# Patient Record
Sex: Female | Born: 1970 | Race: White | Hispanic: No | Marital: Single | Smoking: Never smoker
Health system: Southern US, Community
[De-identification: ages and names within clinical notes are randomized; demographics above are authoritative.]

## PROBLEM LIST (undated history)

## (undated) DIAGNOSIS — F419 Anxiety disorder, unspecified: Secondary | ICD-10-CM

## (undated) DIAGNOSIS — Z87440 Personal history of urinary (tract) infections: Secondary | ICD-10-CM

## (undated) DIAGNOSIS — I341 Nonrheumatic mitral (valve) prolapse: Secondary | ICD-10-CM

## (undated) DIAGNOSIS — J309 Allergic rhinitis, unspecified: Secondary | ICD-10-CM

## (undated) DIAGNOSIS — I10 Essential (primary) hypertension: Secondary | ICD-10-CM

## (undated) HISTORY — DX: Allergic rhinitis, unspecified: J30.9

## (undated) HISTORY — PX: TUBAL LIGATION: SHX77

## (undated) HISTORY — DX: Anxiety disorder, unspecified: F41.9

## (undated) HISTORY — DX: Personal history of urinary (tract) infections: Z87.440

---

## 2001-02-17 DIAGNOSIS — I341 Nonrheumatic mitral (valve) prolapse: Secondary | ICD-10-CM

## 2001-02-17 HISTORY — DX: Nonrheumatic mitral (valve) prolapse: I34.1

## 2008-11-26 ENCOUNTER — Emergency Department (HOSPITAL_COMMUNITY): Admission: EM | Admit: 2008-11-26 | Discharge: 2008-11-26 | Payer: Self-pay | Admitting: Emergency Medicine

## 2009-01-04 ENCOUNTER — Emergency Department: Payer: Self-pay | Admitting: Emergency Medicine

## 2009-08-03 ENCOUNTER — Emergency Department (HOSPITAL_COMMUNITY): Admission: EM | Admit: 2009-08-03 | Discharge: 2009-08-04 | Payer: Self-pay | Admitting: Emergency Medicine

## 2011-01-27 ENCOUNTER — Encounter: Payer: Self-pay | Admitting: *Deleted

## 2011-01-27 ENCOUNTER — Emergency Department (HOSPITAL_BASED_OUTPATIENT_CLINIC_OR_DEPARTMENT_OTHER)
Admission: EM | Admit: 2011-01-27 | Discharge: 2011-01-27 | Disposition: A | Discharge status: Home / Self Care | Payer: BC Managed Care – PPO | Attending: Emergency Medicine | Admitting: Emergency Medicine

## 2011-01-27 DIAGNOSIS — M549 Dorsalgia, unspecified: Secondary | ICD-10-CM | POA: Insufficient documentation

## 2011-01-27 DIAGNOSIS — N390 Urinary tract infection, site not specified: Secondary | ICD-10-CM | POA: Insufficient documentation

## 2011-01-27 LAB — URINALYSIS, ROUTINE W REFLEX MICROSCOPIC
Protein, ur: 100 mg/dL — AB
Urobilinogen, UA: 1 mg/dL (ref 0.0–1.0)

## 2011-01-27 LAB — PREGNANCY, URINE: Preg Test, Ur: NEGATIVE

## 2011-01-27 LAB — URINE MICROSCOPIC-ADD ON

## 2011-01-27 MED ORDER — CIPROFLOXACIN HCL 500 MG PO TABS: 500.0000 mg | ORAL_TABLET | Freq: Two times a day (BID) | ORAL | Status: AC

## 2011-01-27 MED ORDER — HYDROCODONE-ACETAMINOPHEN 5-500 MG PO TABS: 1.0000 | ORAL_TABLET | Freq: Four times a day (QID) | ORAL | Status: AC | PRN

## 2011-01-27 MED ORDER — HYDROCODONE-ACETAMINOPHEN 5-325 MG PO TABS
1.0000 | ORAL_TABLET | Freq: Once | ORAL | Status: AC
  Administered 2011-01-27: 1 via ORAL
  Filled 2011-01-27: qty 1

## 2011-01-27 MED ORDER — CIPROFLOXACIN HCL 500 MG PO TABS
500.0000 mg | ORAL_TABLET | Freq: Once | ORAL | Status: AC
  Administered 2011-01-27: 500 mg via ORAL
  Filled 2011-01-27: qty 1

## 2011-01-27 NOTE — ED Provider Notes (Signed)
History     CSN: [id number] Arrival date & time: 01/27/2011  8:00 PM   First MD Initiated Contact with Patient 01/27/11 2003      Chief Complaint  Patient presents with  . Back Pain    (Consider location/radiation/quality/duration/timing/severity/associated sxs/prior treatment) Patient is a 40 y.o. female presenting with back pain. The history is provided by the patient. No language interpreter was used.  Back Pain  This is a new problem. The current episode started 6 to 12 hours ago. The problem occurs constantly. The problem has not changed since onset.The pain is associated with no known injury. The pain is present in the lumbar spine. The quality of the pain is described as aching. The pain does not radiate. The pain is moderate. The pain is the same all the time. Pertinent negatives include no bladder incontinence, no dysuria and no tingling.    History reviewed. No pertinent past medical history.  Past Surgical History  Procedure Date  . Tubal ligation     No family history on file.  History  Substance Use Topics  . Smoking status: Never Smoker   . Smokeless tobacco: Not on file  . Alcohol Use: No    OB History    Grav Para Term Preterm Abortions TAB SAB Ect Mult Living                  Review of Systems  Genitourinary: Negative for bladder incontinence and dysuria.  Musculoskeletal: Positive for back pain.  Neurological: Negative for tingling.  All other systems reviewed and are negative.    Allergies  Aspirin  Home Medications   Current Outpatient Rx  Name Route Sig Dispense Refill  . FLUTICASONE PROPIONATE 50 MCG/ACT NA SUSP Nasal Place 2 sprays into the nose daily as needed. For allergies     . VICODIN PO Oral Take 1 tablet by mouth once.      . IBUPROFEN 200 MG PO TABS Oral Take 800 mg by mouth every 6 (six) hours as needed. For pain     . ROBAXIN PO Oral Take 2 tablets by mouth once.      Marland Kitchen CIPROFLOXACIN HCL 500 MG PO TABS Oral Take 1 tablet  (500 mg total) by mouth every 12 (twelve) hours. 6 tablet 0  . HYDROCODONE-ACETAMINOPHEN 5-500 MG PO TABS Oral Take 1-2 tablets by mouth every 6 (six) hours as needed for pain. 6 tablet 0    BP 150/98  Pulse 83  Temp(Src) 98.8 F (37.1 C) (Oral)  Resp 18  Ht 5\' 6"  (1.676 m)  Wt 270 lb (122.471 kg)  BMI 43.58 kg/m2  SpO2 100%  Physical Exam  Nursing note and vitals reviewed. Constitutional: She is oriented to person, place, and time. She appears well-developed and well-nourished.  HENT:  Head: Normocephalic and atraumatic.  Eyes: Pupils are equal, round, and reactive to light.  Neck: Normal range of motion.  Cardiovascular: Normal rate and regular rhythm.   Pulmonary/Chest: Effort normal and breath sounds normal.  Abdominal: Soft. Bowel sounds are normal. She exhibits no distension. There is no tenderness.  Musculoskeletal: Normal range of motion.  Neurological: She is alert and oriented to person, place, and time.  Skin: Skin is warm and dry.  Psychiatric: She has a normal mood and affect.    ED Course  Procedures (including critical care time)  Labs Reviewed  URINALYSIS, ROUTINE W REFLEX MICROSCOPIC - Abnormal; Notable for the following:    APPearance CLOUDY (*)    Hgb  urine dipstick LARGE (*)    Protein, ur 100 (*)    Nitrite POSITIVE (*)    Leukocytes, UA MODERATE (*)    All other components within normal limits  URINE MICROSCOPIC-ADD ON - Abnormal; Notable for the following:    Bacteria, UA MANY (*)    All other components within normal limits  PREGNANCY, URINE   No results found.   1. UTI (lower urinary tract infection)       MDM  Pt treated her for uti:no sign of pyelo    Medical screening examination/treatment/procedure(s) were performed by non-physician practitioner and as supervising physician I was immediately available for consultation/collaboration. Osvaldo Human, M.D.     Teressa Lower, NP 01/27/11 2015  Carleene Cooper III,  MD 01/28/11 1330

## 2011-01-27 NOTE — ED Notes (Signed)
Back pain since she woke this am. Denies urinary complaints.

## 2011-04-09 ENCOUNTER — Encounter (HOSPITAL_BASED_OUTPATIENT_CLINIC_OR_DEPARTMENT_OTHER): Payer: Self-pay | Admitting: *Deleted

## 2011-04-09 ENCOUNTER — Emergency Department (HOSPITAL_BASED_OUTPATIENT_CLINIC_OR_DEPARTMENT_OTHER)
Admission: EM | Admit: 2011-04-09 | Discharge: 2011-04-09 | Disposition: A | Discharge status: Home Health | Payer: No Typology Code available for payment source | Attending: Emergency Medicine | Admitting: Emergency Medicine

## 2011-04-09 DIAGNOSIS — I10 Essential (primary) hypertension: Secondary | ICD-10-CM | POA: Insufficient documentation

## 2011-04-09 DIAGNOSIS — Y92410 Unspecified street and highway as the place of occurrence of the external cause: Secondary | ICD-10-CM | POA: Insufficient documentation

## 2011-04-09 DIAGNOSIS — S139XXA Sprain of joints and ligaments of unspecified parts of neck, initial encounter: Secondary | ICD-10-CM | POA: Insufficient documentation

## 2011-04-09 DIAGNOSIS — S161XXA Strain of muscle, fascia and tendon at neck level, initial encounter: Secondary | ICD-10-CM

## 2011-04-09 HISTORY — DX: Essential (primary) hypertension: I10

## 2011-04-09 HISTORY — DX: Nonrheumatic mitral (valve) prolapse: I34.1

## 2011-04-09 MED ORDER — METHOCARBAMOL 500 MG PO TABS: 500.0000 mg | ORAL_TABLET | Freq: Two times a day (BID) | ORAL | Status: DC

## 2011-04-09 MED ORDER — METHOCARBAMOL 500 MG PO TABS: 500.0000 mg | ORAL_TABLET | Freq: Two times a day (BID) | ORAL | Status: AC

## 2011-04-09 MED ORDER — HYDROCODONE-ACETAMINOPHEN 5-325 MG PO TABS: 2.0000 | ORAL_TABLET | ORAL | Status: AC | PRN

## 2011-04-09 MED ORDER — HYDROCODONE-ACETAMINOPHEN 5-325 MG PO TABS: 2.0000 | ORAL_TABLET | ORAL | Status: DC | PRN

## 2011-04-09 NOTE — Discharge Instructions (Signed)
Cervical Sprain and Strain °A cervical sprain is an injury to the neck. The injury can include either over-stretching or even small tears in the ligaments that hold the bones of the neck in place. A strain affects muscles and tendons. Minor injuries usually only involve ligaments and muscles. Because the different parts of the neck are so close together, more severe injuries can involve both sprain and strain. These injuries can affect the muscles, ligaments, tendons, discs, and nerves in the neck. °CAUSES  °An injury may be the result of a direct blow or from certain habits that can lead to the symptoms noted above. °· Injury from:  °· Contact sports (such as football, rugby, wrestling, hockey, auto racing, gymnastics, diving, martial arts, and boxing).  °· Motor vehicle accidents.  °· Whiplash injuries (see image at right). These are common. They occur when the neck is forcefully whipped or forced backward and/or forward.  °· Falls.  °· Lifestyle or awkward postures:  °· Cradling a telephone between the ear and shoulder.  °· Sitting in a chair that offers no support.  °· Working at an ill-designed computer station.  °· Activities that require hours of repeated or long periods of looking up (stretching the neck backward) or looking down (bending the head/neck forward).  °SYMPTOMS  °· Pain, soreness, stiffness, or burning sensation in the front, back, or sides of the neck. This may develop immediately after injury. Onset of discomfort may also develop slowly and not begin for 24 hours or more.  °· Shoulder and/or upper back pain.  °· Limits to the normal movement of the neck.  °· Headache.  °· Dizziness.  °· Weakness and/or abnormal sensation (such as numbness or tingling) of one or both arms and/or hands.  °· Muscle spasm.  °· Difficulty with swallowing or chewing.  °· Tenderness and swelling at the injury site.  °DIAGNOSIS  °Most of the time, your caregiver can diagnose this problem with a careful history and  examination. The history will include information about known problems (such as arthritis in the neck) or a previous neck injury. X-rays may be ordered to find out if there is a different problem. X-rays can also help to find problems with the bones of the neck not related to the injury or current symptoms. °TREATMENT  °Several treatment options are available to help pain, spasm, and other symptoms. They include: °· Cold helps relieve pain and reduce inflammation. Cold should be applied for 10 to 15 minutes every 2 to 3 hours after any activity that aggravates your symptoms. Use ice packs or an ice massage. Place a towel or cloth in between your skin and the ice pack.  °· Medication:  °· Only take over-the-counter or prescription medicines for pain, discomfort, or fever as directed by your caregiver.  °· Pain relievers or muscle relaxants may be prescribed. Use only as directed and only as much as you need.  °· Change in the activity that caused the problem. This might include using a headset with a telephone so that the phone is not propped between your ear and shoulder.  °· Neck collar. Your caregiver may recommend temporary use of a soft cervical collar.  °· Work station. Changes may be needed in your work place. A better sitting position and/or better posture during work may be part of your treatment.  °· Physical Therapy. Your caregiver may recommend physical therapy. This can include instructions in the use of stretching and strengthening exercises. Improvement in posture is important.   Exercises and posture training can help stabilize the neck and strengthen muscles and keep symptoms from returning.  °HOME CARE INSTRUCTIONS  °Other than formal physical therapy, all treatments above can be done at home. Even when not at work, it is important to be conscious of your posture and of activities that can cause a return of symptoms. °Most cervical sprains and/or strains are better in 1-3 weeks. As you improve and  increase activities, doing a warm up and stretching before the activity will help prevent recurrent problems. °SEEK MEDICAL CARE IF:  °· Pain is not effectively controlled with medication.  °· You feel unable to decrease pain medication over time as planned.  °· Activity level is not improving as planned and/or expected.  °SEEK IMMEDIATE MEDICAL CARE IF:  °· While using medication, you develop any bleeding, stomach upset, or signs of an allergic reaction.  °· Symptoms get worse, become intolerable, and are not helped by medications.  °· New, unexplained symptoms develop.  °· You experience numbness, tingling, weakness, or paralysis of any part of your body.  °MAKE SURE YOU:  °· Understand these instructions.  °· Will watch your condition.  °· Will get help right away if you are not doing well or get worse.  °Document Released: 12/01/2006 Document Revised: 10/16/2010 Document Reviewed: 12/01/2006 °[employer]® Patient Information ©2012 [employer]. °
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
## 2011-04-09 NOTE — ED Provider Notes (Signed)
Medical screening examination/treatment/procedure(s) were performed by non-physician practitioner and as supervising physician I was immediately available for consultation/collaboration.   Tyffani Foglesong, MD 04/09/11 2328 
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
## 2011-05-25 ENCOUNTER — Emergency Department (HOSPITAL_BASED_OUTPATIENT_CLINIC_OR_DEPARTMENT_OTHER)
Admission: EM | Admit: 2011-05-25 | Discharge: 2011-05-25 | Disposition: A | Discharge status: Home / Self Care | Payer: BC Managed Care – PPO | Attending: Emergency Medicine | Admitting: Emergency Medicine

## 2011-05-25 ENCOUNTER — Encounter (HOSPITAL_BASED_OUTPATIENT_CLINIC_OR_DEPARTMENT_OTHER): Payer: Self-pay | Admitting: Emergency Medicine

## 2011-05-25 ENCOUNTER — Emergency Department (INDEPENDENT_AMBULATORY_CARE_PROVIDER_SITE_OTHER): Payer: BC Managed Care – PPO

## 2011-05-25 DIAGNOSIS — J069 Acute upper respiratory infection, unspecified: Secondary | ICD-10-CM

## 2011-05-25 DIAGNOSIS — J029 Acute pharyngitis, unspecified: Secondary | ICD-10-CM | POA: Insufficient documentation

## 2011-05-25 DIAGNOSIS — I059 Rheumatic mitral valve disease, unspecified: Secondary | ICD-10-CM | POA: Insufficient documentation

## 2011-05-25 DIAGNOSIS — R059 Cough, unspecified: Secondary | ICD-10-CM | POA: Insufficient documentation

## 2011-05-25 DIAGNOSIS — R05 Cough: Secondary | ICD-10-CM | POA: Insufficient documentation

## 2011-05-25 DIAGNOSIS — R0989 Other specified symptoms and signs involving the circulatory and respiratory systems: Secondary | ICD-10-CM

## 2011-05-25 DIAGNOSIS — I10 Essential (primary) hypertension: Secondary | ICD-10-CM | POA: Insufficient documentation

## 2011-05-25 MED ORDER — LORATADINE-PSEUDOEPHEDRINE ER 10-240 MG PO TB24: 1.0000 | ORAL_TABLET | Freq: Every day | ORAL | Status: DC

## 2011-05-25 MED ORDER — GI COCKTAIL ~~LOC~~
30.0000 mL | Freq: Once | ORAL | Status: AC
  Administered 2011-05-25: 30 mL via ORAL
  Filled 2011-05-25: qty 30

## 2011-05-25 MED ORDER — HYDROCODONE-ACETAMINOPHEN 7.5-500 MG/15ML PO SOLN: 15.0000 mL | Freq: Four times a day (QID) | ORAL | Status: AC | PRN

## 2011-05-25 MED ORDER — LORATADINE 10 MG PO TABS
10.0000 mg | ORAL_TABLET | Freq: Once | ORAL | Status: AC
  Administered 2011-05-25: 10 mg via ORAL
  Filled 2011-05-25: qty 1

## 2011-05-25 MED ORDER — DEXAMETHASONE 4 MG PO TABS
8.0000 mg | ORAL_TABLET | Freq: Once | ORAL | Status: AC
  Administered 2011-05-25: 8 mg via ORAL
  Filled 2011-05-25: qty 2

## 2011-05-25 NOTE — ED Provider Notes (Signed)
History     CSN: [id number]  Arrival date & time 05/25/11  0747   First MD Initiated Contact with Patient 05/25/11 [phone]      Chief Complaint  Patient presents with  . Sore Throat    (Consider location/radiation/quality/duration/timing/severity/associated sxs/prior treatment) Patient is a 41 y.o. female presenting with pharyngitis and URI. The history is provided by the patient. No language interpreter was used.  Sore Throat The current episode started 2 days ago. The problem occurs constantly. The problem has been gradually worsening. Pertinent negatives include no chest pain, no abdominal pain, no headaches and no shortness of breath. The symptoms are aggravated by swallowing and coughing. The symptoms are relieved by nothing. Treatments tried: mucinex. The treatment provided mild relief.  URI The primary symptoms include sore throat and cough. Primary symptoms do not include fever, fatigue, headaches, ear pain, swollen glands, wheezing, abdominal pain, nausea, vomiting, myalgias or arthralgias. The current episode started 2 days ago. This is a new problem. The problem has been gradually worsening.  The cough began 2 days ago. The cough is new. The cough is dry.  Symptoms associated with the illness include congestion and rhinorrhea. The illness is not associated with chills.    Past Medical History  Diagnosis Date  . Hypertension   . MVP (mitral valve prolapse)     Past Surgical History  Procedure Date  . Tubal ligation     No family history on file.  History  Substance Use Topics  . Smoking status: Never Smoker   . Smokeless tobacco: Not on file  . Alcohol Use: No    OB History    Grav Para Term Preterm Abortions TAB SAB Ect Mult Living                  Review of Systems  Constitutional: Negative for fever, chills, activity change, appetite change and fatigue.  HENT: Positive for congestion, sore throat and rhinorrhea. Negative for ear pain, neck pain and neck  stiffness.   Respiratory: Positive for cough and chest tightness. Negative for shortness of breath and wheezing.   Cardiovascular: Negative for chest pain and palpitations.  Gastrointestinal: Negative for nausea, vomiting and abdominal pain.  Genitourinary: Negative for dysuria, urgency, frequency and flank pain.  Musculoskeletal: Negative for myalgias, back pain and arthralgias.  Neurological: Negative for dizziness, weakness, light-headedness, numbness and headaches.  All other systems reviewed and are negative.    Allergies  Aspirin  Home Medications   Current Outpatient Rx  Name Route Sig Dispense Refill  . MUCINEX PO Oral Take by mouth every 12 (twelve) hours.    Marland Kitchen FLUTICASONE PROPIONATE 50 MCG/ACT NA SUSP Nasal Place 2 sprays into the nose daily as needed. For allergies     . HYDROCODONE-ACETAMINOPHEN 7.5-500 MG/15ML PO SOLN Oral Take 15 mLs by mouth every 6 (six) hours as needed for pain. 60 mL 0  . VICODIN PO Oral Take 1 tablet by mouth once.      Marland Kitchen LORATADINE-PSEUDOEPHEDRINE ER 10-240 MG PO TB24 Oral Take 1 tablet by mouth daily. 10 tablet 0  . ROBAXIN PO Oral Take 2 tablets by mouth once.      Marland Kitchen METOPROLOL SUCCINATE ER 50 MG PO TB24 Oral Take 50 mg by mouth daily. Take with or immediately following a meal.    . NAPROXEN 125 MG/5ML PO SUSP Oral Take 250 mg by mouth 2 (two) times daily.      BP 157/97  Pulse 93  Temp(Src) 99.1 F (  37.3 C) (Oral)  Resp 18  SpO2 100%  LMP 04/24/2011  Physical Exam  Nursing note and vitals reviewed. Constitutional: She is oriented to person, place, and time. She appears well-developed and well-nourished. No distress.       Morbidly obese  HENT:  Head: Normocephalic and atraumatic.  Mouth/Throat: No oropharyngeal exudate.       Erythematous oropharynx  Eyes: Conjunctivae and EOM are normal. Pupils are equal, round, and reactive to light.  Neck: Normal range of motion. Neck supple.  Cardiovascular: Normal rate, regular rhythm, normal  heart sounds and intact distal pulses.  Exam reveals no gallop and no friction rub.   No murmur heard. Pulmonary/Chest: Effort normal and breath sounds normal. No respiratory distress. She exhibits tenderness (upper chest tenderness on palpation).  Abdominal: Soft. Bowel sounds are normal. There is no tenderness.  Musculoskeletal: Normal range of motion. She exhibits no edema and no tenderness.  Neurological: She is alert and oriented to person, place, and time. No cranial nerve deficit.  Skin: Skin is warm and dry. No rash noted.    ED Course  Procedures (including critical care time)  Labs Reviewed - No data to display Dg Chest 2 View  05/25/2011  *RADIOLOGY REPORT*  Clinical Data: Cough and congestion.  CHEST - 2 VIEW  Comparison: None.  Findings: No evidence of pulmonary infiltrate, edema or pleural fluid.  Heart size is within normal limits.  Bony thorax is unremarkable.  IMPRESSION: No active disease.  Original Report Authenticated By: Reola Calkins, M.D.     1. URI (upper respiratory infection)       MDM  Consistent with an upper respiratory infection. She does have evidence of mild pharyngitis. She was given Decadron, Claritin emergency department. Just received a GI cocktail. She'll be discharged home with instructions to take Claritin-D, Lortab elixir for symptom control. Talked about her primary care physician. Encouraged aggressive oral hydration. Chest x-ray is negative.        Dayton Bailiff, MD 05/25/11 [phone]

## 2011-05-25 NOTE — ED Notes (Signed)
Pt c/o sore throat & chest congestion since Fri

## 2011-05-25 NOTE — Discharge Instructions (Signed)
Upper Respiratory Infection, Adult An upper respiratory infection (URI) is also known as the common cold. It is often caused by a type of germ (virus). Colds are easily spread (contagious). You can pass it to others by kissing, coughing, sneezing, or drinking out of the same glass. Usually, you get better in 1 or 2 weeks.  HOME CARE   Only take medicine as told by your doctor.   Use a warm mist humidifier or breathe in steam from a hot shower.   Drink enough water and fluids to keep your pee (urine) clear or pale yellow.   Get plenty of rest.   Return to work when your temperature is back to normal or as told by your doctor. You may use a face mask and wash your hands to stop your cold from spreading.  GET HELP RIGHT AWAY IF:   After the first few days, you feel you are getting worse.   You have questions about your medicine.   You have chills, shortness of breath, or brown or red spit (mucus).   You have yellow or brown snot (nasal discharge) or pain in the face, especially when you bend forward.   You have a fever, puffy (swollen) neck, pain when you swallow, or white spots in the back of your throat.   You have a bad headache, ear pain, sinus pain, or chest pain.   You have a high-pitched whistling sound when you breathe in and out (wheezing).   You have a lasting cough or cough up blood.   You have sore muscles or a stiff neck.  MAKE SURE YOU:   Understand these instructions.   Will watch your condition.   Will get help right away if you are not doing well or get worse.  Document Released: 07/23/2007 Document Revised: 01/23/2011 Document Reviewed: 06/10/2010 [facility] Patient Information 2012 [place], [place].

## 2011-08-01 ENCOUNTER — Emergency Department (HOSPITAL_BASED_OUTPATIENT_CLINIC_OR_DEPARTMENT_OTHER)
Admission: EM | Admit: 2011-08-01 | Discharge: 2011-08-01 | Disposition: A | Discharge status: Home / Self Care | Payer: BC Managed Care – PPO | Attending: Emergency Medicine | Admitting: Emergency Medicine

## 2011-08-01 ENCOUNTER — Emergency Department (HOSPITAL_BASED_OUTPATIENT_CLINIC_OR_DEPARTMENT_OTHER): Payer: BC Managed Care – PPO

## 2011-08-01 ENCOUNTER — Encounter (HOSPITAL_BASED_OUTPATIENT_CLINIC_OR_DEPARTMENT_OTHER): Payer: Self-pay | Admitting: Emergency Medicine

## 2011-08-01 DIAGNOSIS — S53401A Unspecified sprain of right elbow, initial encounter: Secondary | ICD-10-CM

## 2011-08-01 DIAGNOSIS — I10 Essential (primary) hypertension: Secondary | ICD-10-CM | POA: Insufficient documentation

## 2011-08-01 DIAGNOSIS — Y9229 Other specified public building as the place of occurrence of the external cause: Secondary | ICD-10-CM | POA: Insufficient documentation

## 2011-08-01 DIAGNOSIS — Y990 Civilian activity done for income or pay: Secondary | ICD-10-CM | POA: Insufficient documentation

## 2011-08-01 DIAGNOSIS — Y93F2 Activity, caregiving, lifting: Secondary | ICD-10-CM | POA: Insufficient documentation

## 2011-08-01 DIAGNOSIS — IMO0002 Reserved for concepts with insufficient information to code with codable children: Secondary | ICD-10-CM | POA: Insufficient documentation

## 2011-08-01 DIAGNOSIS — I059 Rheumatic mitral valve disease, unspecified: Secondary | ICD-10-CM | POA: Insufficient documentation

## 2011-08-01 DIAGNOSIS — X500XXA Overexertion from strenuous movement or load, initial encounter: Secondary | ICD-10-CM | POA: Insufficient documentation

## 2011-08-01 MED ORDER — METOPROLOL SUCCINATE ER 25 MG PO TB24: 50.0000 mg | ORAL_TABLET | Freq: Every day | ORAL | Status: DC

## 2011-08-01 MED ORDER — IBUPROFEN 800 MG PO TABS
800.0000 mg | ORAL_TABLET | Freq: Once | ORAL | Status: AC
  Administered 2011-08-01: 800 mg via ORAL
  Filled 2011-08-01: qty 1

## 2011-08-01 MED ORDER — IBUPROFEN 800 MG PO TABS: 800.0000 mg | ORAL_TABLET | Freq: Three times a day (TID) | ORAL | Status: AC | PRN

## 2011-08-01 NOTE — ED Provider Notes (Signed)
History     CSN: [id number]  Arrival date & time 08/01/11  0020   First MD Initiated Contact with Audrey Rodgers 08/01/11 0038      Chief Complaint  Audrey Rodgers presents with  . Arm Injury    (Consider location/radiation/quality/duration/timing/severity/associated sxs/prior treatment) HPI Audrey Rodgers is a 41 year old female who presents today for evaluation of right elbow pain that she rates as an 8/10 in characterized as a sharp.. This radiates from Audrey Rodgers elbow down Audrey Rodgers forearm and into Audrey Rodgers hand. Audrey Rodgers has noted pain since being handcuffed in police custody yesterday. She works as an Public house manager and also noted pain with movement of some patients this evening at work. Audrey Rodgers says the pain is worse with lifting or movement. She has not taken any medications for this or applied ice. Audrey Rodgers denies history of prior injury. Audrey Rodgers also was noted to be hypertensive on presentation and has been out of Audrey Rodgers blood pressure medications for a month. She denies any chest pain, shortness of breath, or lightheadedness. There are no other associated or modifying factors. Past Medical History  Diagnosis Date  . Hypertension   . MVP (mitral valve prolapse)     Past Surgical History  Procedure Date  . Tubal ligation     History reviewed. No pertinent family history.  History  Substance Use Topics  . Smoking status: Never Smoker   . Smokeless tobacco: Never Used  . Alcohol Use: Yes     rarely    OB History    Grav Para Term Preterm Abortions TAB SAB Ect Mult Living                  Review of Systems  Constitutional: Negative.   HENT: Negative.   Eyes: Negative.   Respiratory: Negative.   Cardiovascular: Negative.   Gastrointestinal: Negative.   Genitourinary: Negative.   Musculoskeletal:       Right elbow pain as noted in history of present illness  Skin: Negative.   Neurological: Negative.   Hematological: Negative.   Psychiatric/Behavioral: Negative.   All other systems reviewed and are  negative.    Allergies  Aspirin  Home Medications   Current Outpatient Rx  Name Route Sig Dispense Refill  . METOPROLOL SUCCINATE ER 50 MG PO TB24 Oral Take 50 mg by mouth daily. Take with or immediately following a meal.    . NAPROXEN 125 MG/5ML PO SUSP Oral Take 250 mg by mouth 2 (two) times daily.    Marland Kitchen FLUTICASONE PROPIONATE 50 MCG/ACT NA SUSP Nasal Place 2 sprays into the nose daily as needed. For allergies     . IBUPROFEN 800 MG PO TABS Oral Take 1 tablet (800 mg total) by mouth every 8 (eight) hours as needed for pain. 30 tablet 0  . LORATADINE-PSEUDOEPHEDRINE ER 10-240 MG PO TB24 Oral Take 1 tablet by mouth daily. 10 tablet 0  . METOPROLOL SUCCINATE ER 25 MG PO TB24 Oral Take 2 tablets (50 mg total) by mouth daily. 30 tablet 1    BP 154/103  Pulse 91  Temp 98.8 F (37.1 C) (Oral)  Resp 16  Ht 5\' 6"  (1.676 m)  Wt 282 lb (127.914 kg)  BMI 45.52 kg/m2  SpO2 98%  LMP 07/21/2011  Physical Exam  Nursing note and vitals reviewed. GEN: Well-developed, well-nourished female in no distress HEENT: Atraumatic, normocephalic.  EYES: PERRLA BL, no scleral icterus. NECK: Trachea midline, no meningismus CV: regular rate and rhythm.  PULM: No respiratory distress.   Neuro: cranial nerves grossly 2-12  intact, no abnormalities of strength or sensation, A and O x 3 MSK: Tenderness to palpation noted over the right proximal forearm. No bony deformity noted. No tenderness to palpation of the right olecranon or deformity. Audrey Rodgers neurovascularly intact distally. Remainder of exam is within normal limits. Skin: No rashes petechiae, purpura, or jaundice Psych: no abnormality of mood   ED Course  Procedures (including critical care time)  Labs Reviewed - No data to display Dg Elbow Complete Right  08/01/2011  *RADIOLOGY REPORT*  Clinical Data: Right elbow pain  RIGHT ELBOW - COMPLETE 3+ VIEW  Comparison: None.  Findings: No displaced fracture or dislocation.  No aggressive osseous  lesion.  No joint effusion.  IMPRESSION: No acute osseous abnormality identified. If clinical concern for a fracture persists, recommend a repeat radiograph in 5-10 days to evaluate for interval change or callus formation.  Original Report Authenticated By: Waneta Martins, M.D.     1. Sprain of right elbow       MDM  Audrey Rodgers was evaluated by myself. Based on evaluation Audrey Rodgers was given ibuprofen and an ice pack. I have low suspicion for bony injury. Plain film was performed and reviewed extemporaneously by myself with no identification of fracture. Radiology interpretation confirmed this. Audrey Rodgers was told that she likely had a strain from positioning given Audrey Rodgers body habitus and being handcuffed. Audrey Rodgers also was given a prescription for Audrey Rodgers blood pressure medication. She did not have any chest pain, shortness of breath, lightheadedness, or other symptoms concerning for hypertensive emergency or urgency. She was discharged in good condition and told that she could followup with Dr. Pearletha Forge a sports medicine if she had continued difficulty with Audrey Rodgers elbow that she should not require any followup based on my evaluation.        Cyndra Numbers, MD 08/01/11 [phone]

## 2011-08-01 NOTE — ED Notes (Signed)
Pt was in handcuffs yesterday and since that time her right elbow and forearm have been hurting.  Today the pain became severe. [Pt has been out of her bp med x1 month]

## 2011-08-01 NOTE — Discharge Instructions (Signed)
Elbow Injury  You or your child has an elbow injury. X-rays and exam today do not show evidence of a fracture (broken bone). That means that only a sling or splint may be required for a brief period of time as directed by your caregiver.  HOME CARE INSTRUCTIONS   Only take over-the-counter or prescription medicines for pain, discomfort, or fever as directed by your caregiver.   If you have a splint held on with an elastic wrap or a cast, watch your hand or fingers. If they become numb or cold and blue, loosen the wrap and reapply more loosely. See your caregiver if there is no relief.   You may use ice on your elbow for 15 to 20 minutes, 3 to 4 times per day, for the first 2 to 3 days.   Use your elbow as directed.   See your caregiver as directed. It is very important to keep all follow-up referrals and appointments in order to avoid any long-term problems with your elbow including chronic pain or inability to move the elbow normally.  SEEK IMMEDIATE MEDICAL CARE IF:    There is swelling or increasing pain in your elbow which is not relieved with medications.   You begin to lose feeling in your hand or fingers, or develop swelling of the hand and fingers.   You get a cold or blue hand or fingers on injured side.   If your elbow remains sore, your caregiver may want to x-ray it again. A hairline fracture may not show up on the first x-rays and may only be seen on repeat x-rays ten days to two weeks later. A specialist (radiologist) may examine your x-rays at a later time. In order to get results from the radiologist or their department, make sure you know how and when you are to get that information. It is your responsibility to get results of any tests you may have had.  MAKE SURE YOU:    Understand these instructions.   Will watch your condition.   Will get help right away if you are not doing well or get worse.  Document Released: 04/26/2003 Document Revised: 01/23/2011 Document Reviewed:  09/22/2007  [employer] Patient Information 2012 [employer].

## 2011-08-01 NOTE — ED Notes (Signed)
Pt has pain to right arm that starts above elbow and radiates down her arm after being arrested yesterday and placed in handcuffs. Pt has difficulty grasping objects with right hand.

## 2012-02-02 ENCOUNTER — Ambulatory Visit (INDEPENDENT_AMBULATORY_CARE_PROVIDER_SITE_OTHER): Payer: BC Managed Care – PPO | Admitting: Medical

## 2012-02-02 ENCOUNTER — Encounter: Payer: Self-pay | Admitting: Medical

## 2012-02-02 VITALS — BP 140/100 | HR 82 | Temp 98.5°F | Resp 18 | Ht 65.0 in | Wt 279.0 lb

## 2012-02-02 DIAGNOSIS — J309 Allergic rhinitis, unspecified: Secondary | ICD-10-CM

## 2012-02-02 DIAGNOSIS — I059 Rheumatic mitral valve disease, unspecified: Secondary | ICD-10-CM

## 2012-02-02 DIAGNOSIS — I341 Nonrheumatic mitral (valve) prolapse: Secondary | ICD-10-CM

## 2012-02-02 DIAGNOSIS — I10 Essential (primary) hypertension: Secondary | ICD-10-CM

## 2012-02-02 DIAGNOSIS — R079 Chest pain, unspecified: Secondary | ICD-10-CM

## 2012-02-02 DIAGNOSIS — E669 Obesity, unspecified: Secondary | ICD-10-CM

## 2012-02-02 DIAGNOSIS — M2550 Pain in unspecified joint: Secondary | ICD-10-CM

## 2012-02-02 LAB — CBC WITH DIFFERENTIAL/PLATELET
Basophils Relative: 0 % (ref 0–1)
HCT: 37.5 % (ref 36.0–46.0)
Hemoglobin: 12.4 g/dL (ref 12.0–15.0)
Lymphocytes Relative: 32 % (ref 12–46)
Lymphs Abs: 2.2 10*3/uL (ref 0.7–4.0)
MCHC: 33.1 g/dL (ref 30.0–36.0)
Monocytes Absolute: 0.4 10*3/uL (ref 0.1–1.0)
Monocytes Relative: 6 % (ref 3–12)
Neutro Abs: 4 10*3/uL (ref 1.7–7.7)

## 2012-02-02 MED ORDER — DICLOFENAC SODIUM 75 MG PO TBEC: 75.0000 mg | DELAYED_RELEASE_TABLET | Freq: Two times a day (BID) | ORAL | Status: DC

## 2012-02-02 NOTE — Progress Notes (Signed)
Subjective:   HPI  Audrey Rodgers is a 41 y.o. female who presents as a new patient.  Was seeing [facility] prior, but ended up moving here for work.  She notes uncontrolled BP despite being on Toprol 50mg  XL. Been on this since she was diagnosed with hypertension.  No other medications have been tried.  Gets headaches regularly.  Thinks this is related to her BP.  She is a nonsmoker.  She is not exercising, not currently using diet discretion.  She notes multiple joint pains ongoing.  Knees always hurt, but gets pains in elbows, wrists as well.  Last echocardiogram about 9-10 years ago. She reports being under a lot of stress.  Gets occasion chest pains.  Sometimes worse with exercise, sometimes worse with stress, sometimes seems related to being gassy and bloated.     No other c/o.  The following portions of the patient's history were reviewed and updated as appropriate: allergies, current medications, past family history, past medical history, past social history, past surgical history and problem list.  Past Medical History  Diagnosis Date  . Hypertension   . MVP (mitral valve prolapse) 2003    echocardiogram   . Allergic rhinitis   . History of recurrent urinary tract infection   . Anxiety     Allergies  Allergen Reactions  . Aspirin Palpitations     Review of Systems ROS reviewed and was negative other than noted in HPI or above.    Objective:   Physical Exam  General appearance: alert, no distress, WD/WN, obese, white female Oral cavity: MMM, no lesions Neck: supple, no lymphadenopathy, no thyromegaly, no masses, no bruits Heart: RRR, normal S1, S2, no murmurs Lungs: CTA bilaterally, no wheezes, rhonchi, or rales Pulses: 2+ symmetric, upper and lower extremities, normal cap refill Neuro: CN2-12 intact, normal strength and sensation, normal DTRs, nonfocal exam   Adult ECG Report  Indication: hypertension, chest pain  Rate: 71 bpm  Rhythm: normal sinus  rhythm  QRS Axis: 71 degrees  PR Interval: 134 ms  QRS Duration: 78 ms  QTc: 432 ms  Conduction Disturbances: none  Other Abnormalities: nonspecific T wave inversion V1  Patient's cardiac risk factors are: hypertension and obesity (BMI >= 30 kg/m2).  EKG comparison: none  Narrative Interpretation: normal EKG   Assessment and Plan :     Encounter Diagnoses  Name Primary?  . Essential hypertension, benign Yes  . Obesity   . Chest pain   . MVP (mitral valve prolapse)   . Allergic rhinitis   . Polyarthralgia    HTN - pending labs, consider increasing Metoprolol to 100 mg and adding a second medication.  Advised weight loss, diet changes, low salt diet.  Obesity - discussed need to work on lifestyle changes and lose weight  Chest pain - multifactorial, likely GERD, anxiety associated  MVP - request prior records  Allergic rhinitis- advised OTC zyrtec daily  polyarthralgia - script for trial of Diclofenac, weight loss

## 2012-02-03 ENCOUNTER — Other Ambulatory Visit: Payer: Self-pay | Admitting: Medical

## 2012-02-03 LAB — COMPREHENSIVE METABOLIC PANEL
Alkaline Phosphatase: 60 U/L (ref 39–117)
BUN: 12 mg/dL (ref 6–23)
Creat: 0.64 mg/dL (ref 0.50–1.10)
Glucose, Bld: 100 mg/dL — ABNORMAL HIGH (ref 70–99)
Total Bilirubin: 0.8 mg/dL (ref 0.3–1.2)

## 2012-02-03 LAB — LIPID PANEL
Cholesterol: 160 mg/dL (ref 0–200)
HDL: 46 mg/dL (ref >39)
Total CHOL/HDL Ratio: 3.5 Ratio
Triglycerides: 97 mg/dL (ref <150)

## 2012-02-03 LAB — T4, FREE: Free T4: 1.14 ng/dL (ref 0.80–1.80)

## 2012-02-03 MED ORDER — LISINOPRIL 10 MG PO TABS: 10.0000 mg | ORAL_TABLET | Freq: Every day | ORAL | Status: DC

## 2012-02-03 MED ORDER — METOPROLOL SUCCINATE ER 100 MG PO TB24: 100.0000 mg | ORAL_TABLET | Freq: Every day | ORAL | Status: DC

## 2012-03-08 ENCOUNTER — Ambulatory Visit (INDEPENDENT_AMBULATORY_CARE_PROVIDER_SITE_OTHER): Payer: BC Managed Care – PPO | Admitting: Medical

## 2012-03-08 ENCOUNTER — Encounter: Payer: Self-pay | Admitting: Medical

## 2012-03-08 VITALS — BP 110/70 | HR 73 | Temp 98.2°F | Resp 16 | Wt 282.0 lb

## 2012-03-08 DIAGNOSIS — R079 Chest pain, unspecified: Secondary | ICD-10-CM

## 2012-03-08 DIAGNOSIS — M25561 Pain in right knee: Secondary | ICD-10-CM

## 2012-03-08 DIAGNOSIS — R51 Headache: Secondary | ICD-10-CM

## 2012-03-08 DIAGNOSIS — M25569 Pain in unspecified knee: Secondary | ICD-10-CM

## 2012-03-08 DIAGNOSIS — I10 Essential (primary) hypertension: Secondary | ICD-10-CM

## 2012-03-08 DIAGNOSIS — E669 Obesity, unspecified: Secondary | ICD-10-CM

## 2012-03-08 MED ORDER — DICLOFENAC SODIUM 75 MG PO TBEC: 75.0000 mg | DELAYED_RELEASE_TABLET | Freq: Two times a day (BID) | ORAL | Status: DC

## 2012-03-08 NOTE — Progress Notes (Signed)
  Subjective:   HPI  Audrey Rodgers is a 42 y.o. female who presents for 1 mo recheck.   Saw me in December as a new patient.  Was seeing [facility] prior, but ended up moving here for work.  She notes prior to moving here had uncontrolled BP despite being on Toprol 50mg  XL. Been on this since she was diagnosed with hypertension.  No other medications have been tried.  Was getting headaches regularly.  She is a nonsmoker.  Has hx/o MVP on echo, last echocardiogram about 9-10 years ago. She reports being under a lot of stress.  Was getting occasion chest pains.  Sometimes worse with exercise, sometimes worse with stress, sometimes seems related to being gassy and bloated.     Since last visit after we increased Toprol to 100mg  XL and adding Lisinopril, she has had no more chest pain or headaches.  BP is the best it has ever been.    She is not exercising, but is planning to join the gym, has started making diet changes.  She is a Engineer, civil (consulting).  She notes multiple joint pains ongoing.  Knees always hurt, and although the Diclofenac has made a big difference, still having lots of knee pains.  No other c/o.  The following portions of the patient's history were reviewed and updated as appropriate: allergies, current medications, past family history, past medical history, past social history, past surgical history and problem list.  Past Medical History  Diagnosis Date  . Hypertension   . MVP (mitral valve prolapse) 2003    echocardiogram   . Allergic rhinitis   . History of recurrent urinary tract infection   . Anxiety     Allergies  Allergen Reactions  . Aspirin Palpitations   Review of Systems ROS reviewed and was negative other than noted in HPI or above.    Objective:   Physical Exam  General appearance: alert, no distress, WD/WN, obese, white female Oral cavity: MMM, no lesions Neck: supple, no lymphadenopathy, no thyromegaly, no masses, no bruits Heart: RRR, normal S1, S2,  no murmurs Lungs: CTA bilaterally, no wheezes, rhonchi, or rales Pulses: 2+ symmetric, upper and lower extremities, normal cap refill MSK:  bilat flat feet.  Left anteromedial knee joint line pain, but otherwise nontender, bilat knees, no swelling, negative special tests, but she is guarded with valgus and varus stresses although nontender with those isolated tests and no obvoius laxity, rest of LE exam unremarkable Neuro: CN2-12 intact, normal strength and sensation, normal DTRs, nonfocal exam    Assessment and Plan :     Encounter Diagnoses  Name Primary?  . Essential hypertension, benign Yes  . Knee pain, bilateral   . Obesity   . Chest pain   . Headache    HTN - much improved.  C/t same medications, Lisinopril 10mg  daily and Toprol XL 100mg  daily.  Advised weight loss, diet changes, low salt diet.  Knee pain - will send for xrays to further evaluate, but likely due to obesity and flat feet.  Advised she go to [employer] or [employer] to get fitting for better supportive shoes with increased arch since she is mostly flat footed.    Obesity - discussed need to work on lifestyle changes and lose weight  Chest pain - resolved  Headaches - much improved

## 2012-03-08 NOTE — Patient Instructions (Addendum)
Begin exercise with either stationary bike, or walking on treadmill or elliptical machine, or lap swimming.  All of these are good low impact exercise which shouldn't give a whole lot of knee pain.     Avoid running, aerobics, anything that requires a lot of lateral or side to side movement.    Try and exercise 4-7 days per week.  Start 30 minutes each time but work up to 1 hour each time.     Diet: Eat 4-6 times daily - but in small portions.  For example small breakfast, lunch, and dinner, but eat a mid morning and mid afternoon snack.   I recommend a healthy diet.    Do's:   whole grains such as whole grain pasta, rice, whole grains breads and whole grain cereals.  Use small quantities such as 1/2 cup per serving or 2 slices of bread per serving.    Eat 3-5 fruits daily  Eat beans at least once daily  Eat almonds in small quantities at least 3 days per week    If they eat meat, I recommend small portions of lean meats such as chicken, fish, and [place].  Eat as much NON corn and NON potato vegetables as they like, particularly raw or steamed  Drink several large glasses of water daily  Cautions:  Limit red meat  Limit corn and potatoes  Limit sweets, cake, pie, candy  Limit beer and alcohol  Avoid fried food, fast food, large portions  Avoid sugary drinks such as regular soda and sweet tea

## 2012-05-19 ENCOUNTER — Telehealth: Payer: Self-pay | Admitting: Medical

## 2012-05-19 NOTE — Telephone Encounter (Signed)
I called and I left a message for the patient in reference to where she can get her xray done. CLS

## 2012-06-03 ENCOUNTER — Other Ambulatory Visit: Payer: Self-pay | Admitting: Medical

## 2012-07-14 ENCOUNTER — Ambulatory Visit
Admission: RE | Admit: 2012-07-14 | Discharge: 2012-07-14 | Disposition: A | Discharge status: Home / Self Care | Payer: BC Managed Care – PPO | Source: Ambulatory Visit | Attending: Medical | Admitting: Medical

## 2012-07-14 DIAGNOSIS — M25562 Pain in left knee: Secondary | ICD-10-CM

## 2012-07-19 ENCOUNTER — Encounter: Payer: Self-pay | Admitting: Medical

## 2012-07-19 ENCOUNTER — Telehealth: Payer: Self-pay | Admitting: Family Medicine

## 2012-07-19 ENCOUNTER — Ambulatory Visit (INDEPENDENT_AMBULATORY_CARE_PROVIDER_SITE_OTHER): Payer: BC Managed Care – PPO | Admitting: Medical

## 2012-07-19 VITALS — BP 120/82 | HR 62 | Temp 98.5°F | Resp 16 | Wt 279.0 lb

## 2012-07-19 DIAGNOSIS — G2581 Restless legs syndrome: Secondary | ICD-10-CM

## 2012-07-19 DIAGNOSIS — E669 Obesity, unspecified: Secondary | ICD-10-CM

## 2012-07-19 DIAGNOSIS — M25569 Pain in unspecified knee: Secondary | ICD-10-CM

## 2012-07-19 DIAGNOSIS — M25562 Pain in left knee: Secondary | ICD-10-CM

## 2012-07-19 DIAGNOSIS — I10 Essential (primary) hypertension: Secondary | ICD-10-CM

## 2012-07-19 MED ORDER — METOPROLOL SUCCINATE ER 100 MG PO TB24: 100.0000 mg | ORAL_TABLET | Freq: Every day | ORAL | Status: AC

## 2012-07-19 MED ORDER — LISINOPRIL 10 MG PO TABS: 10.0000 mg | ORAL_TABLET | Freq: Every day | ORAL | Status: AC

## 2012-07-19 MED ORDER — ROPINIROLE HCL 0.5 MG PO TABS: 0.5000 mg | ORAL_TABLET | Freq: Every day | ORAL | Status: DC

## 2012-07-19 MED ORDER — DICLOFENAC SODIUM 75 MG PO TBEC: 75.0000 mg | DELAYED_RELEASE_TABLET | Freq: Two times a day (BID) | ORAL | Status: DC

## 2012-07-19 NOTE — Progress Notes (Signed)
Subjective: Here for general recheck.    Last 2 months feels like something in her throat. Tickles her throat, can give a cough.   This past week at the beach didn't notice this at all, but in general, gets this tickle in her throat.  Denies reflux of food.  She notes hx/o GERD, but this seems different.  Denies allergy symptoms.  Takes zyrtec daily.  Currently not taking GERD medication.    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  Denies chest pain, sob, DOE.  At times thought when really hot, can get sensation of need to take full breath.    HTN - compliant with medication, no issues with the medication.  She does snore.  No witness apnea, but can easily fall asleep in the evenings.    Knees continue to hurt, 24/7 pain.   Has stairs at home, has to take one step at a time.  Gets legs pains, worse in the evening, restless feeling.  Seems like legs can't stop moving in the evening when she sits to rest.  Trying to eat right.  Will lose 5-6 lbs at a time, then week later will have gained the weight back.  Tries to limit fast food.  Past Medical History  Diagnosis Date  . Hypertension   . MVP (mitral valve prolapse) 2003    echocardiogram   . Allergic rhinitis   . History of recurrent urinary tract infection   . Anxiety    ROS as in subjective  Objective: Filed Vitals:   07/19/12 1337  BP: 120/82  Pulse: 62  Temp: 98.5 F (36.9 C)  Resp: 16    General appearance: alert, no distress, WD/WN, obese white female Neck: supple, no lymphadenopathy, no thyromegaly, no masses Heart: RRR, normal S1, S2, no murmurs Lungs: CTA bilaterally, no wheezes, rhonchi, or rales Pulses: 2+ symmetric MSK: no obvious swelling or deformity of knees, mild medial joint line tenderness bilat, normal laxity, negative special tests Legs neurovascualrly intact Ext: no edema   Assessment: Encounter Diagnoses  Name Primary?  . Essential hypertension, benign Yes  . Obesity   . Knee  pain, bilateral   . RLS (restless legs syndrome)      Plan: HTN - c/t current medication, work on weight loss  Obesity - advised lifestyle changes, diet and exercise changes, discussed strategies  Knee pain - c/t diclofenac, referral to orthopedics for additional management  RLS - symptoms suggestive of RLS, begin trial of Requip QHS  Follow-up 1-77mo, referral to ortho

## 2012-07-19 NOTE — Telephone Encounter (Signed)
Patient is aware of her appointment to see Dr. Cleophas Dunker on 07/23/12 @ 945 am at [facility]. CLS [phone]

## 2012-07-19 NOTE — Patient Instructions (Addendum)
I recommend exercising most days of the week using a type of exercise that they would enjoy and stick to such as walking, running, swimming, hiking, biking, aerobics, etc.  I recommend a healthy diet.    Do's:   whole grains such as whole grain pasta, rice, whole grains breads and whole grain cereals.  Use small quantities such as 1/2 cup per serving or 2 slices of bread per serving.    Eat 3-5 fruits daily  Eat beans at least once daily  Eat almonds in small quantities at least 3 days per week    If they eat meat, I recommend small portions of lean meats such as chicken, fish, and turkey.  Eat as much NON corn and NON potato vegetables as they like, particularly raw or steamed  Drink several large glasses of water daily  Cautions:  Limit red meat  Limit corn and potatoes  Limit sweets, cake, pie, candy  Limit beer and alcohol  Avoid fried food, fast food, large portions  Avoid sugary drinks such as regular soda and sweet tea 

## 2012-07-19 NOTE — Telephone Encounter (Signed)
Message copied by Janeice Robinson on Mon Jul 19, 2012  3:54 PM ------      Message from: Aleen Campi, DAVID S      Created: Mon Jul 19, 2012  2:11 PM       Ortho referral - bilat knee pain, send OV notes, copy of xrays ------

## 2012-07-22 ENCOUNTER — Telehealth: Payer: Self-pay | Admitting: Medical

## 2012-07-26 ENCOUNTER — Telehealth: Payer: Self-pay | Admitting: Medical

## 2012-09-10 ENCOUNTER — Telehealth: Payer: Self-pay | Admitting: Medical

## 2012-09-10 NOTE — Telephone Encounter (Signed)
LM

## 2012-10-12 ENCOUNTER — Ambulatory Visit: Payer: BC Managed Care – PPO | Admitting: Medical

## 2012-11-10 ENCOUNTER — Ambulatory Visit: Payer: Self-pay | Admitting: Family Medicine

## 2012-11-10 ENCOUNTER — Other Ambulatory Visit: Payer: Self-pay | Admitting: Medical

## 2012-11-10 ENCOUNTER — Telehealth: Payer: Self-pay | Admitting: Internal Medicine

## 2012-11-10 NOTE — Telephone Encounter (Signed)
Pt states she has swallowed a piece of meat and it went down into her windpipe and is congested and having problems talking. She said she has infection and would like an antibotic sent to [facility] on [address]  i told pt i wasn't sure since she had not been seen for this if you would send it or not but she wanted me to send a note back

## 2012-11-10 NOTE — Telephone Encounter (Signed)
LMOM TO CB. CLS 

## 2012-11-10 NOTE — Telephone Encounter (Signed)
pls find out more.  If SOB, trouble breathing, or severe coughing, go to ED.  If not, can be seen here.

## 2012-11-10 NOTE — Telephone Encounter (Signed)
Patient is coming in for an appointment on 11/10/12. CLS

## 2012-11-11 ENCOUNTER — Ambulatory Visit (INDEPENDENT_AMBULATORY_CARE_PROVIDER_SITE_OTHER): Payer: BC Managed Care – PPO | Admitting: Family Medicine

## 2012-11-11 ENCOUNTER — Encounter: Payer: Self-pay | Admitting: Family Medicine

## 2012-11-11 VITALS — BP 120/80 | HR 66 | Temp 98.5°F

## 2012-11-11 DIAGNOSIS — J209 Acute bronchitis, unspecified: Secondary | ICD-10-CM

## 2012-11-11 MED ORDER — AMOXICILLIN 875 MG PO TABS: 875.0000 mg | ORAL_TABLET | Freq: Two times a day (BID) | ORAL | Status: AC

## 2012-11-11 NOTE — Progress Notes (Signed)
  Subjective:    Patient ID: Audrey Rodgers, female    DOB: 1970-06-16, 42 y.o.   MRN: [id number]  HPI 10 Days ago she felt as if she inhaled a piece of meat she did have some chest congestion for several days after. She developed a dry cough after that as well as sinus congestion with PND she has an intermittently productive cough and also hoarse voice. She does not smoke and has no allergies.   Review of Systems     Objective:   Physical Exam alert and in no distress. Tympanic membranes and canals are normal. Throat is clear. Tonsils are normal. Neck is supple without adenopathy or thyromegaly. Cardiac exam shows a regular sinus rhythm without murmurs or gallops. Lungs are clear to auscultation. Nasal mucosa is slightly pinkish red but nontender sinuses        Assessment & Plan:   Acute bronchitis - Plan: amoxicillin (AMOXIL) 875 MG tablet  splinted she probably also has a component of sinus infection and the antibiotic should help. She is to call if not entirely better when she finishes the medicine.

## 2012-11-11 NOTE — Patient Instructions (Signed)
Take all the antibiotic and if not call me back to normal give [place] a call

## 2012-12-10 ENCOUNTER — Other Ambulatory Visit: Payer: Self-pay | Admitting: Medical

## 2012-12-13 NOTE — Telephone Encounter (Signed)
i sent 30 day refill, but she is due for f/u, make an appt

## 2012-12-13 NOTE — Telephone Encounter (Signed)
Is this okay?

## 2012-12-14 NOTE — Telephone Encounter (Signed)
I left the patient a voicemail about her medication and her follow up appointment that needs to be made. CLS

## 2014-01-06 ENCOUNTER — Telehealth: Payer: Self-pay | Admitting: Internal Medicine

## 2014-01-06 NOTE — Telephone Encounter (Signed)
Faxed over medical records to [facility] @ [phone] on 12/26/13

## 2014-04-05 IMAGING — CR DG CHEST 2V
2 series · 2 of 2 positions shown · non-contrast
Comparison: None.

CLINICAL DATA: Cough and congestion.

CHEST - 2 VIEW

[w chest pa]
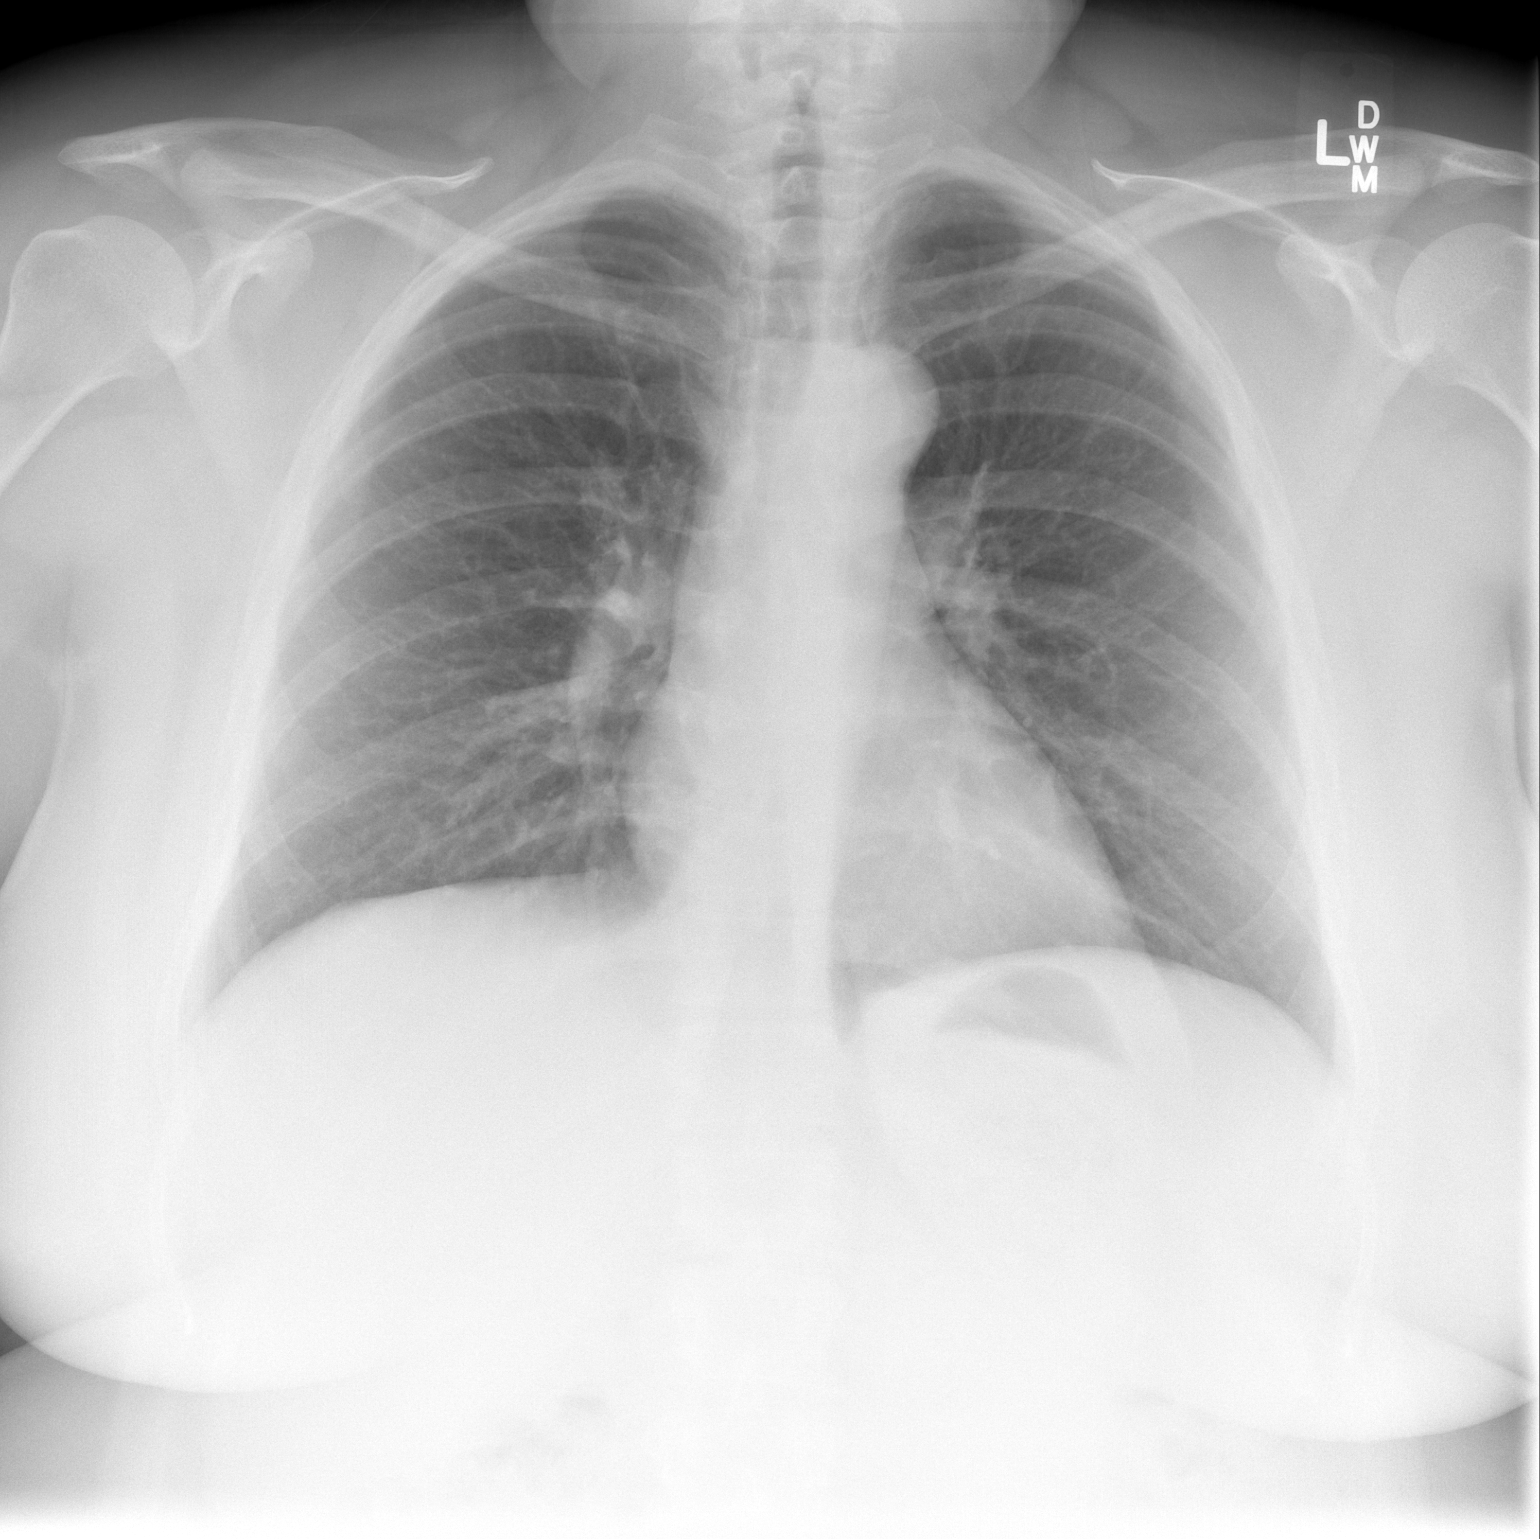

[w chest lat]
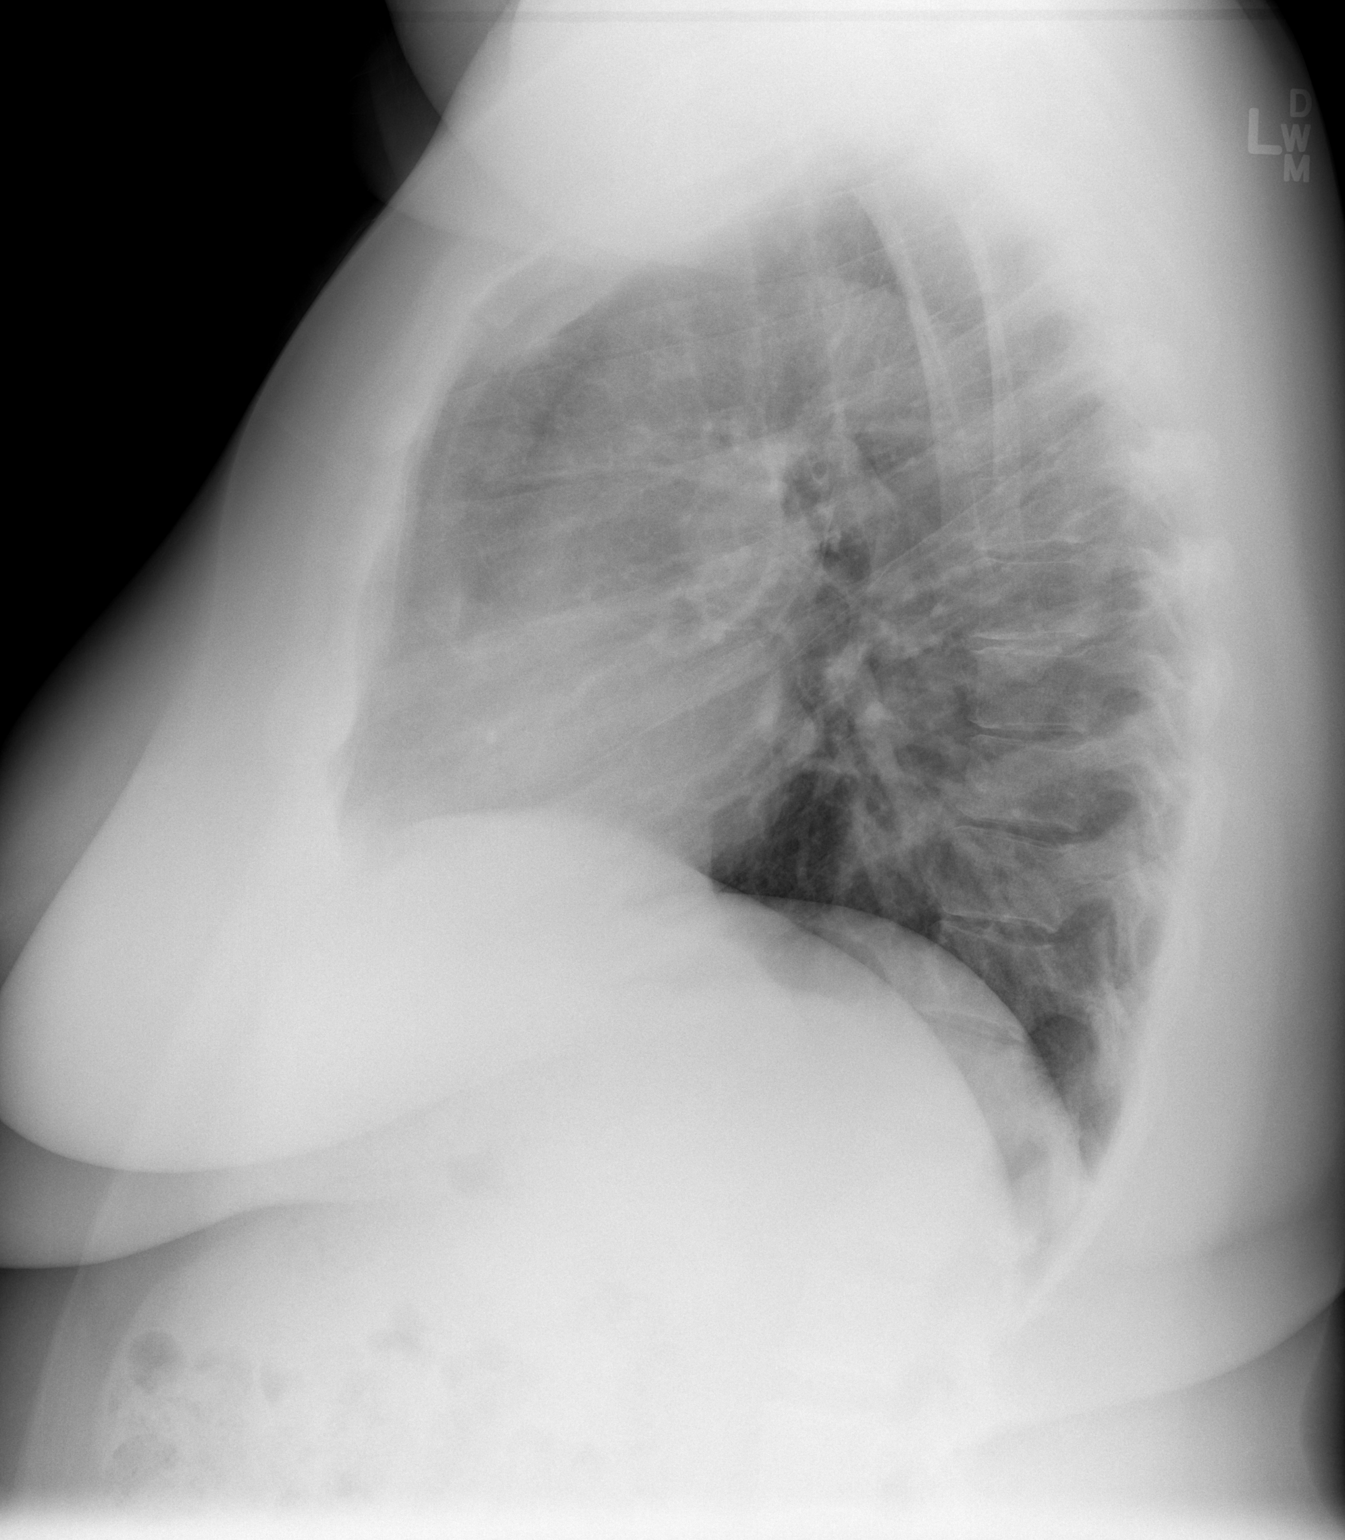

[2 of 2 positions shown; findings below may reference images not displayed]

FINDINGS: No evidence of pulmonary infiltrate, edema or pleural
fluid.  Heart size is within normal limits.  Bony thorax is
unremarkable.
IMPRESSION: No active disease.

## 2014-06-12 IMAGING — CR DG ELBOW COMPLETE 3+V*R*
4 series · 4 of 4 positions shown · non-contrast
Comparison: None.

CLINICAL DATA: Right elbow pain

RIGHT ELBOW - COMPLETE 3+ VIEW

[x elbow joint ap right]
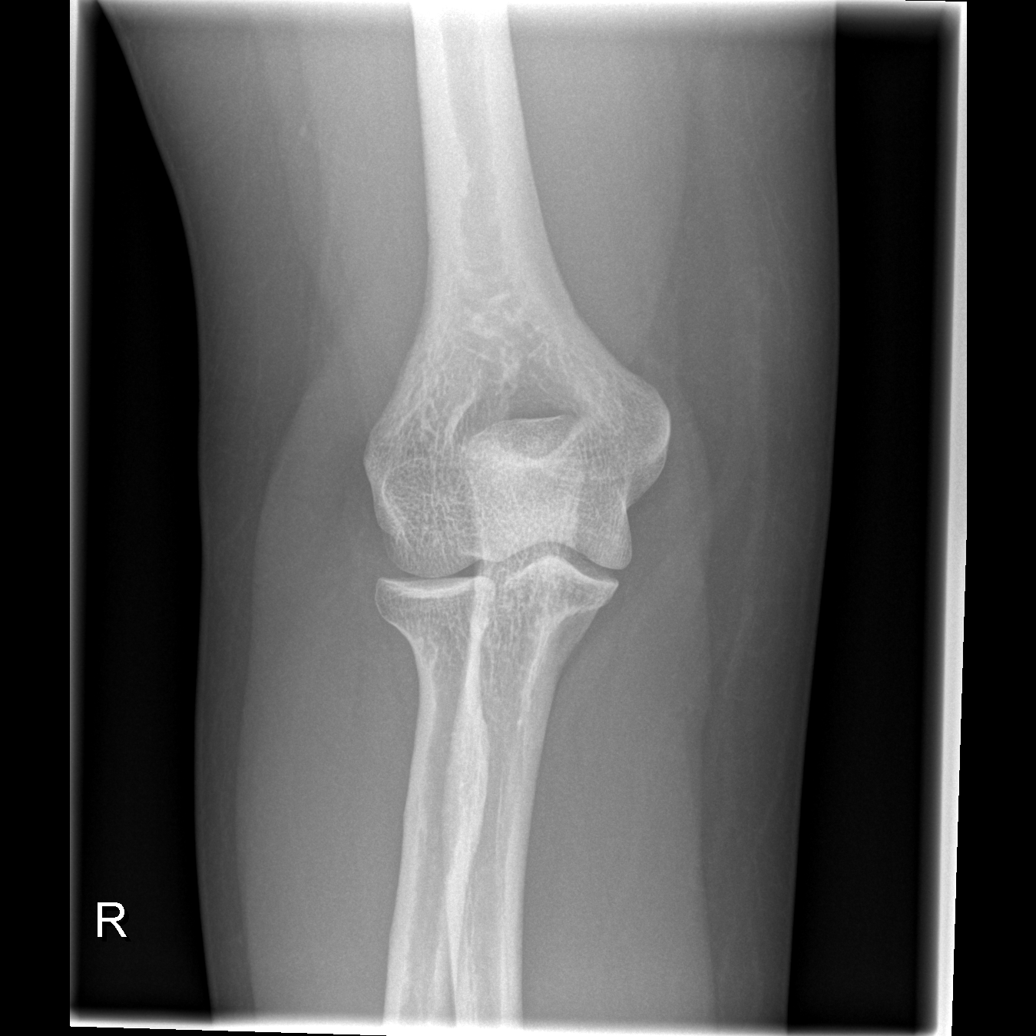

[x elbow joint obl. right (1 of 2)]
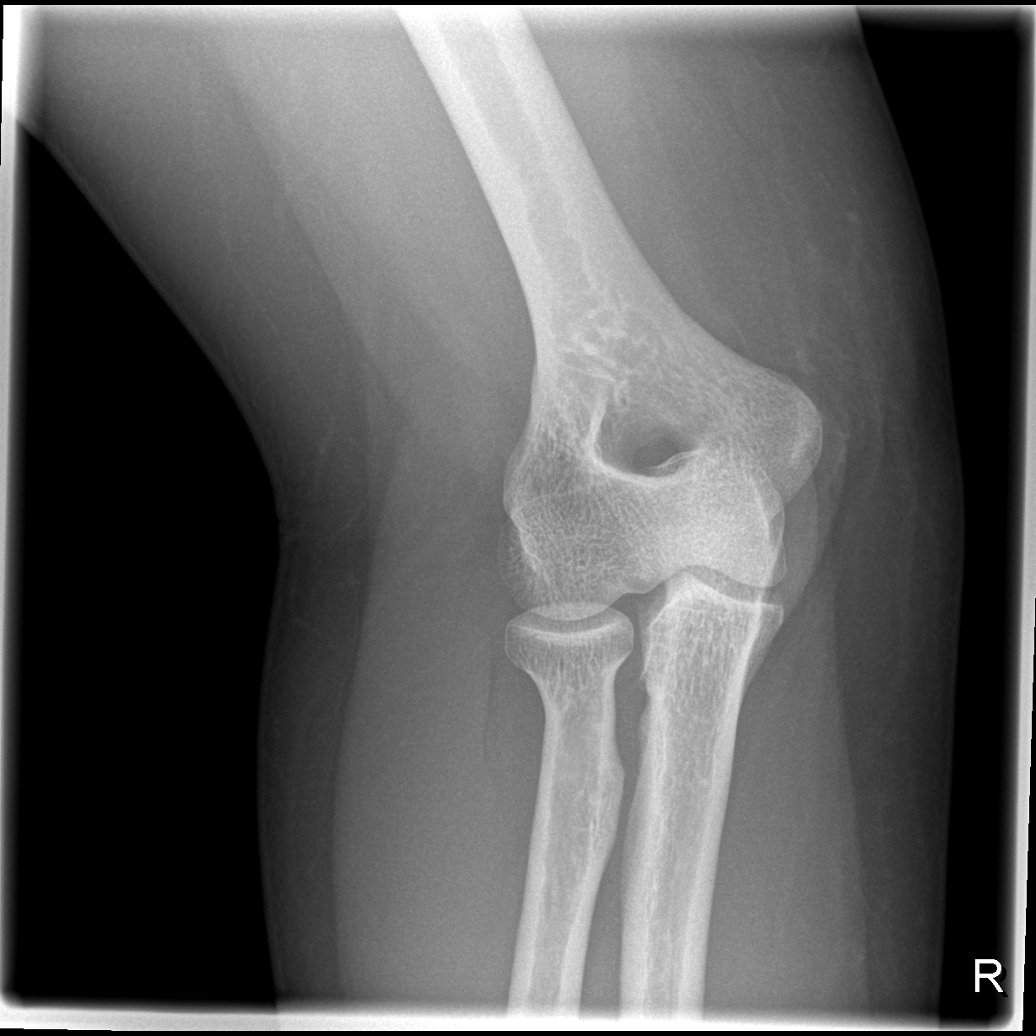

[x elbow joint obl. right (2 of 2)]
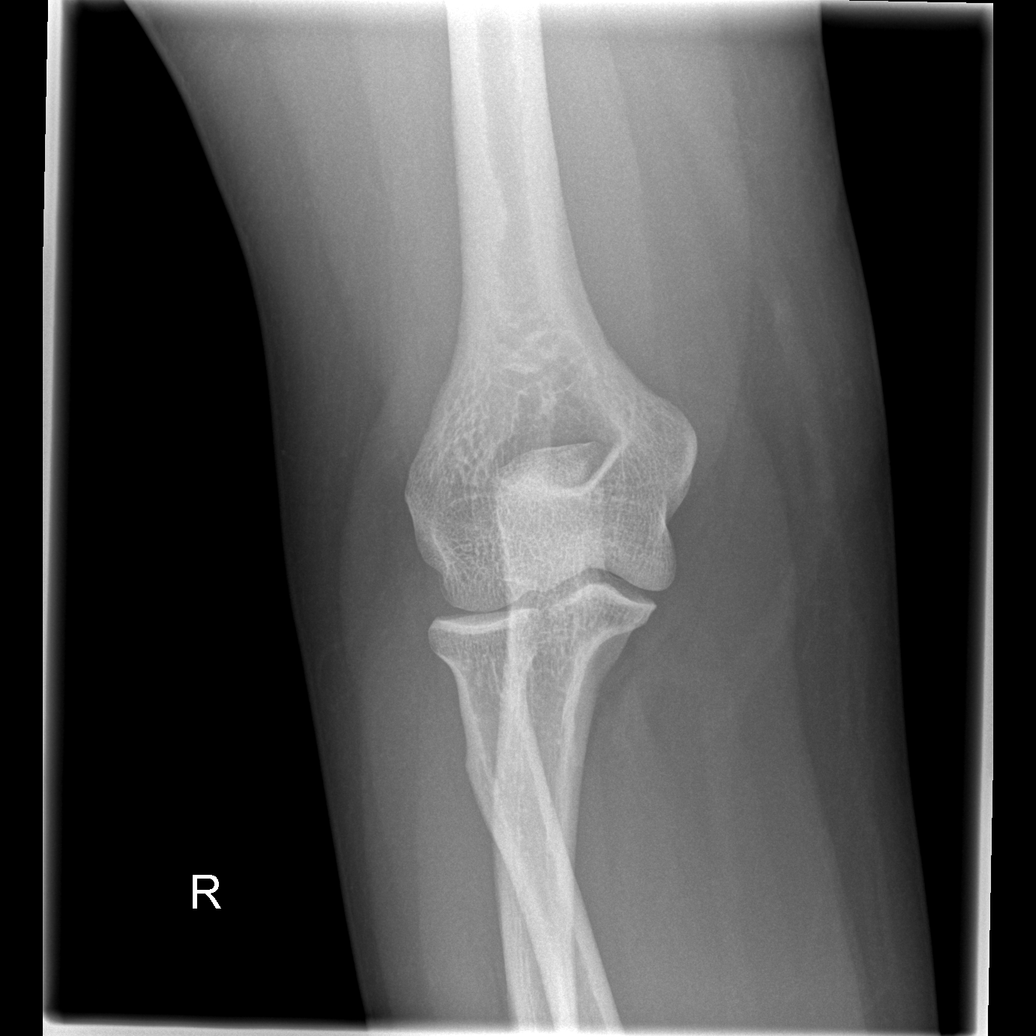

[x elbow joint lat right]
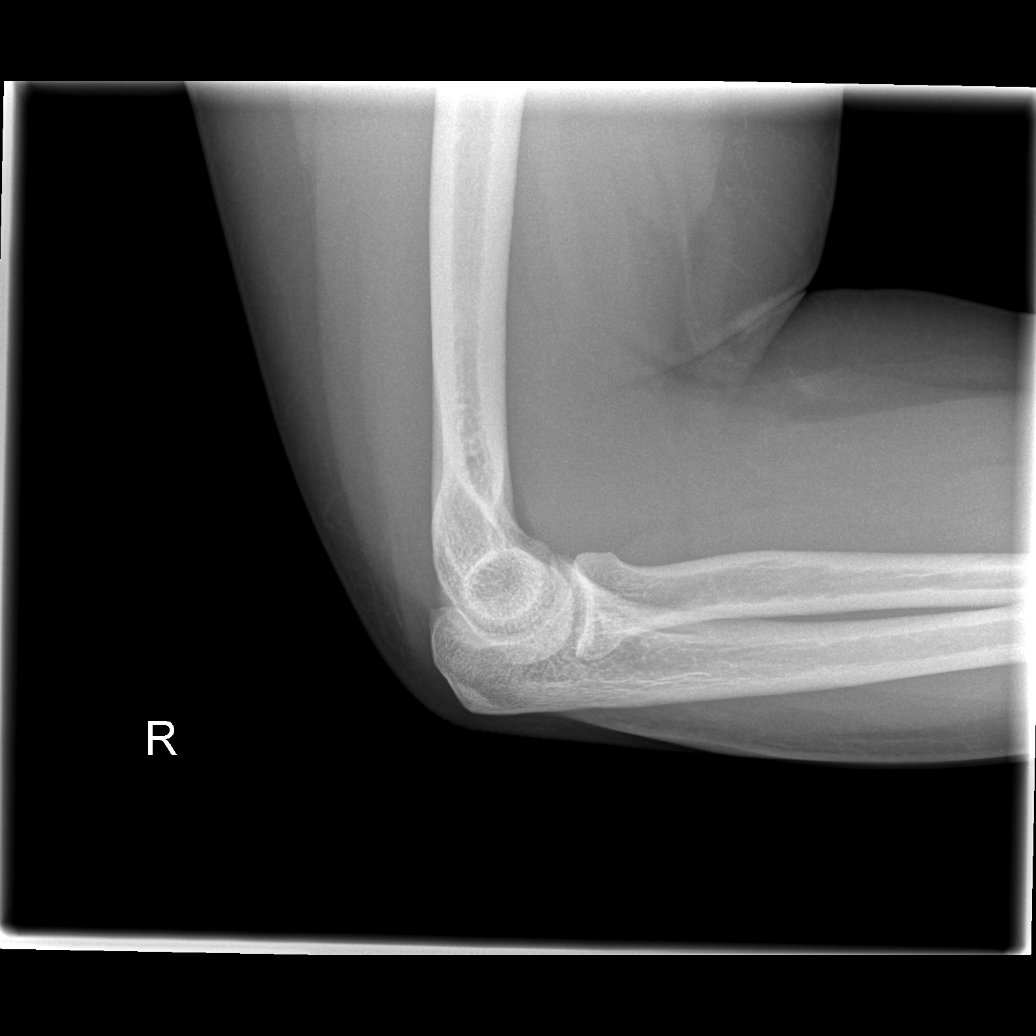

[4 of 4 positions shown; findings below may reference images not displayed]

FINDINGS: No displaced fracture or dislocation.  No aggressive
osseous lesion.  No joint effusion.
IMPRESSION: No acute osseous abnormality identified. If clinical concern for a
fracture persists, recommend a repeat radiograph in 5-10 days to
evaluate for interval change or callus formation.

## 2015-05-26 IMAGING — CR DG KNEE 1-2V*R*
2 series · 2 of 2 positions shown · non-contrast
Comparison: Left knee performed today.

CLINICAL DATA: Bilateral knee pain, swelling.

RIGHT KNEE - 1-2 VIEW

[t knee ap right *]
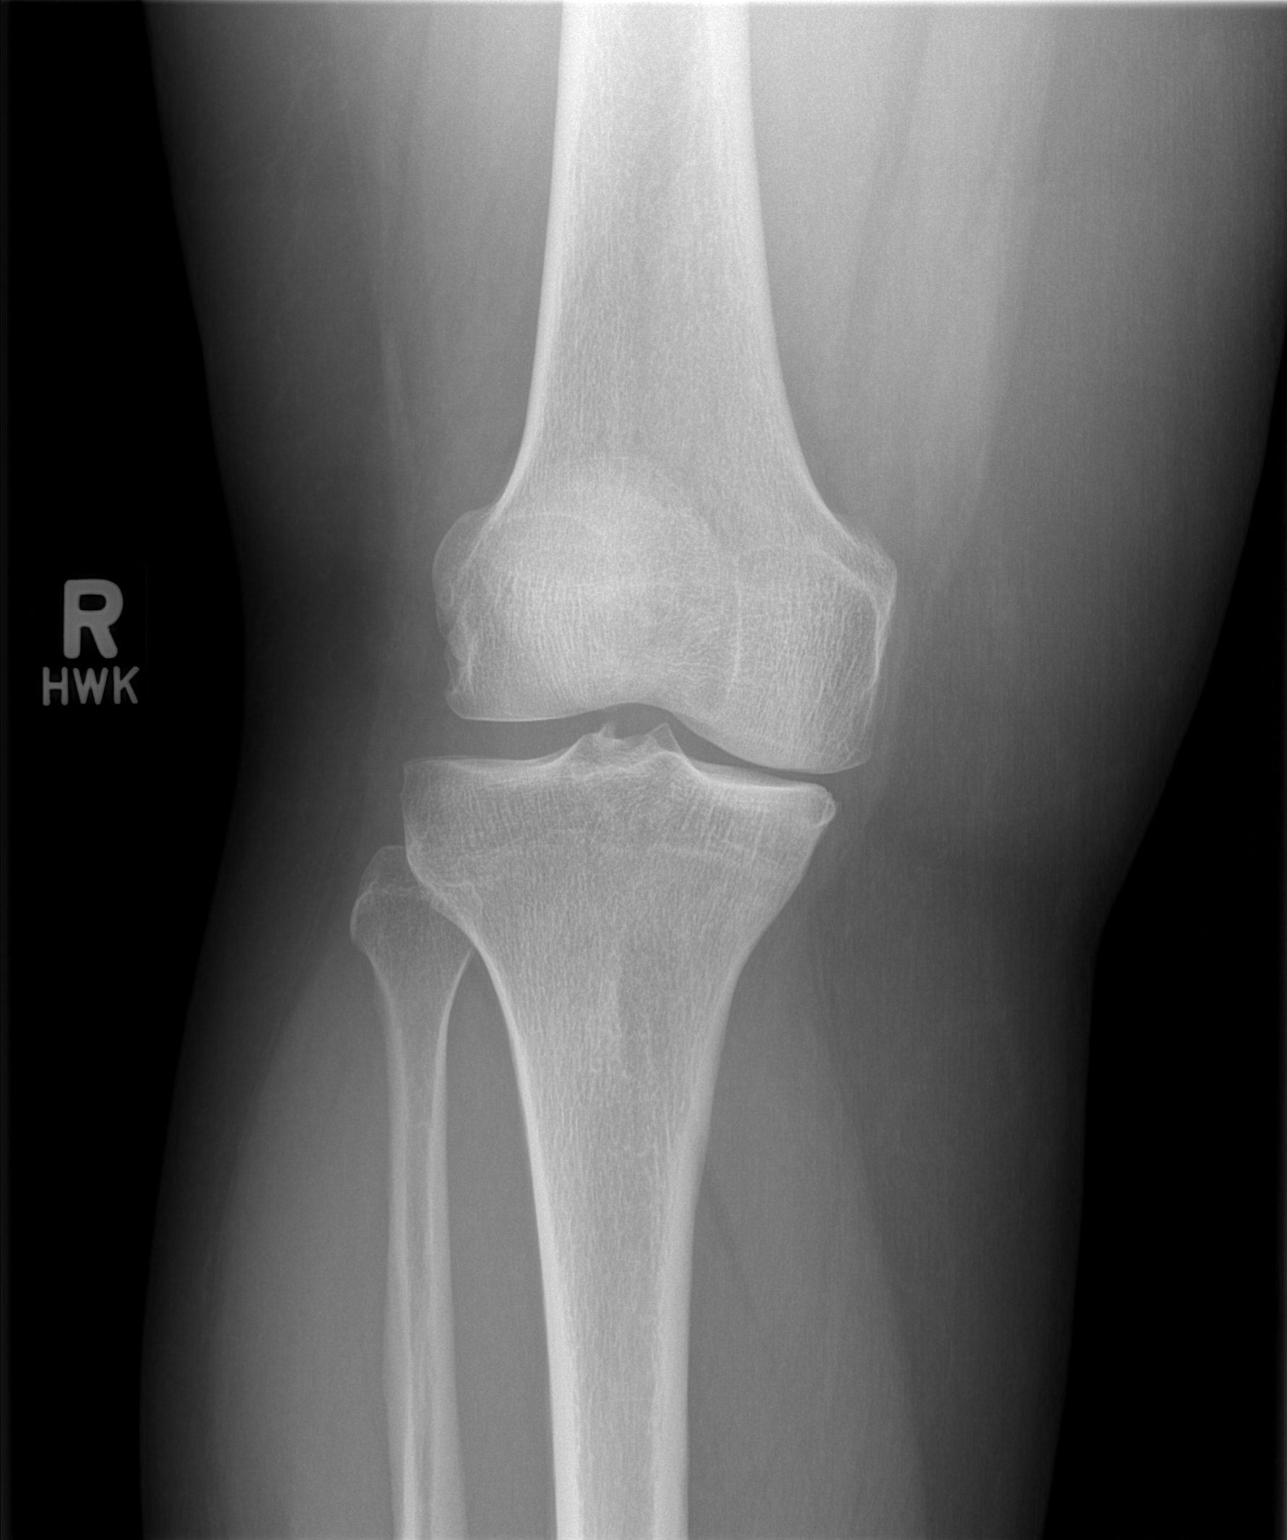

[t knee lat right]
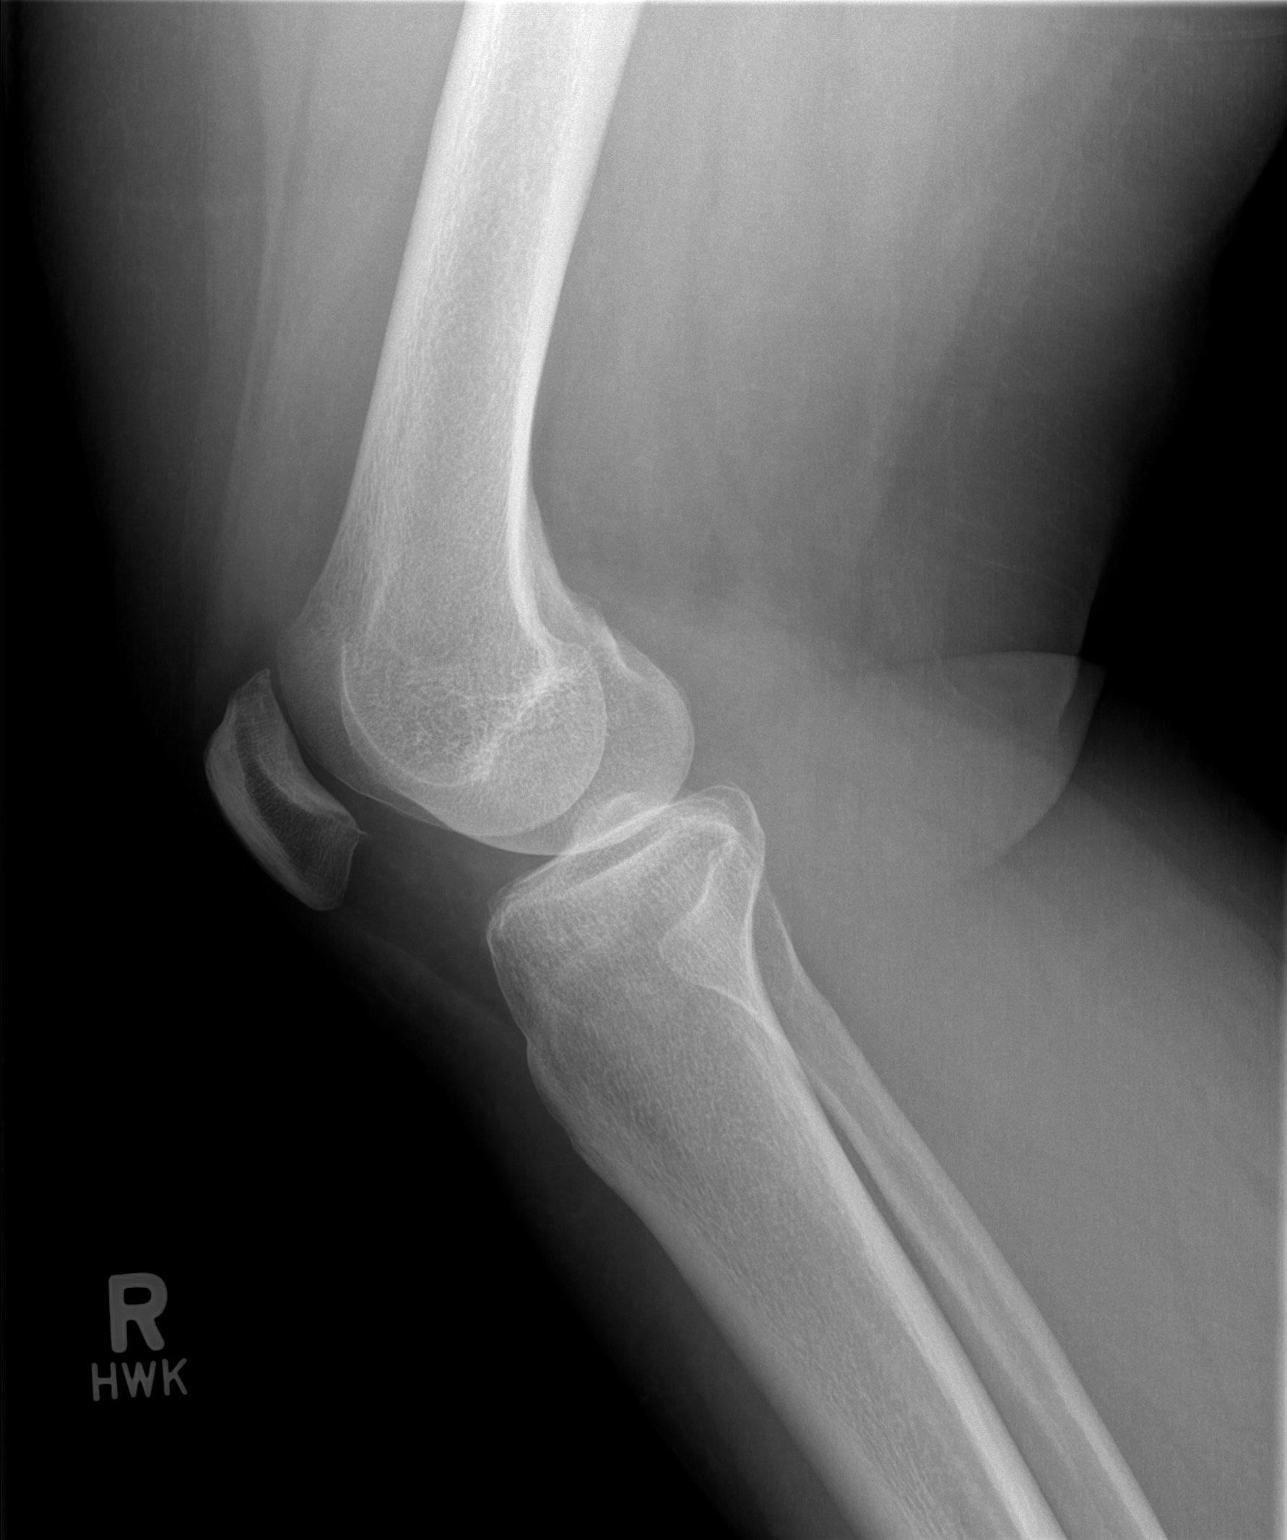

[2 of 2 positions shown; findings below may reference images not displayed]

FINDINGS: Mild degenerative changes with joint space narrowing and
early spurring.  Joint space narrowing is most pronounced in the
medial compartment. No acute bony abnormality.  Specifically, no
fracture, subluxation, or dislocation.  Soft tissues are intact.
Small joint effusion suspected.
IMPRESSION: Mild degenerative changes, most pronounced in the medial
compartment.  Small joint effusion.  No acute findings.

## 2015-05-26 IMAGING — CR DG KNEE 1-2V*L*
2 series · 2 of 2 positions shown · non-contrast
Comparison: Right knee performed today.

CLINICAL DATA: Bilateral knee pain.

LEFT KNEE - 1-2 VIEW

[t knee lat left (1 of 2)]
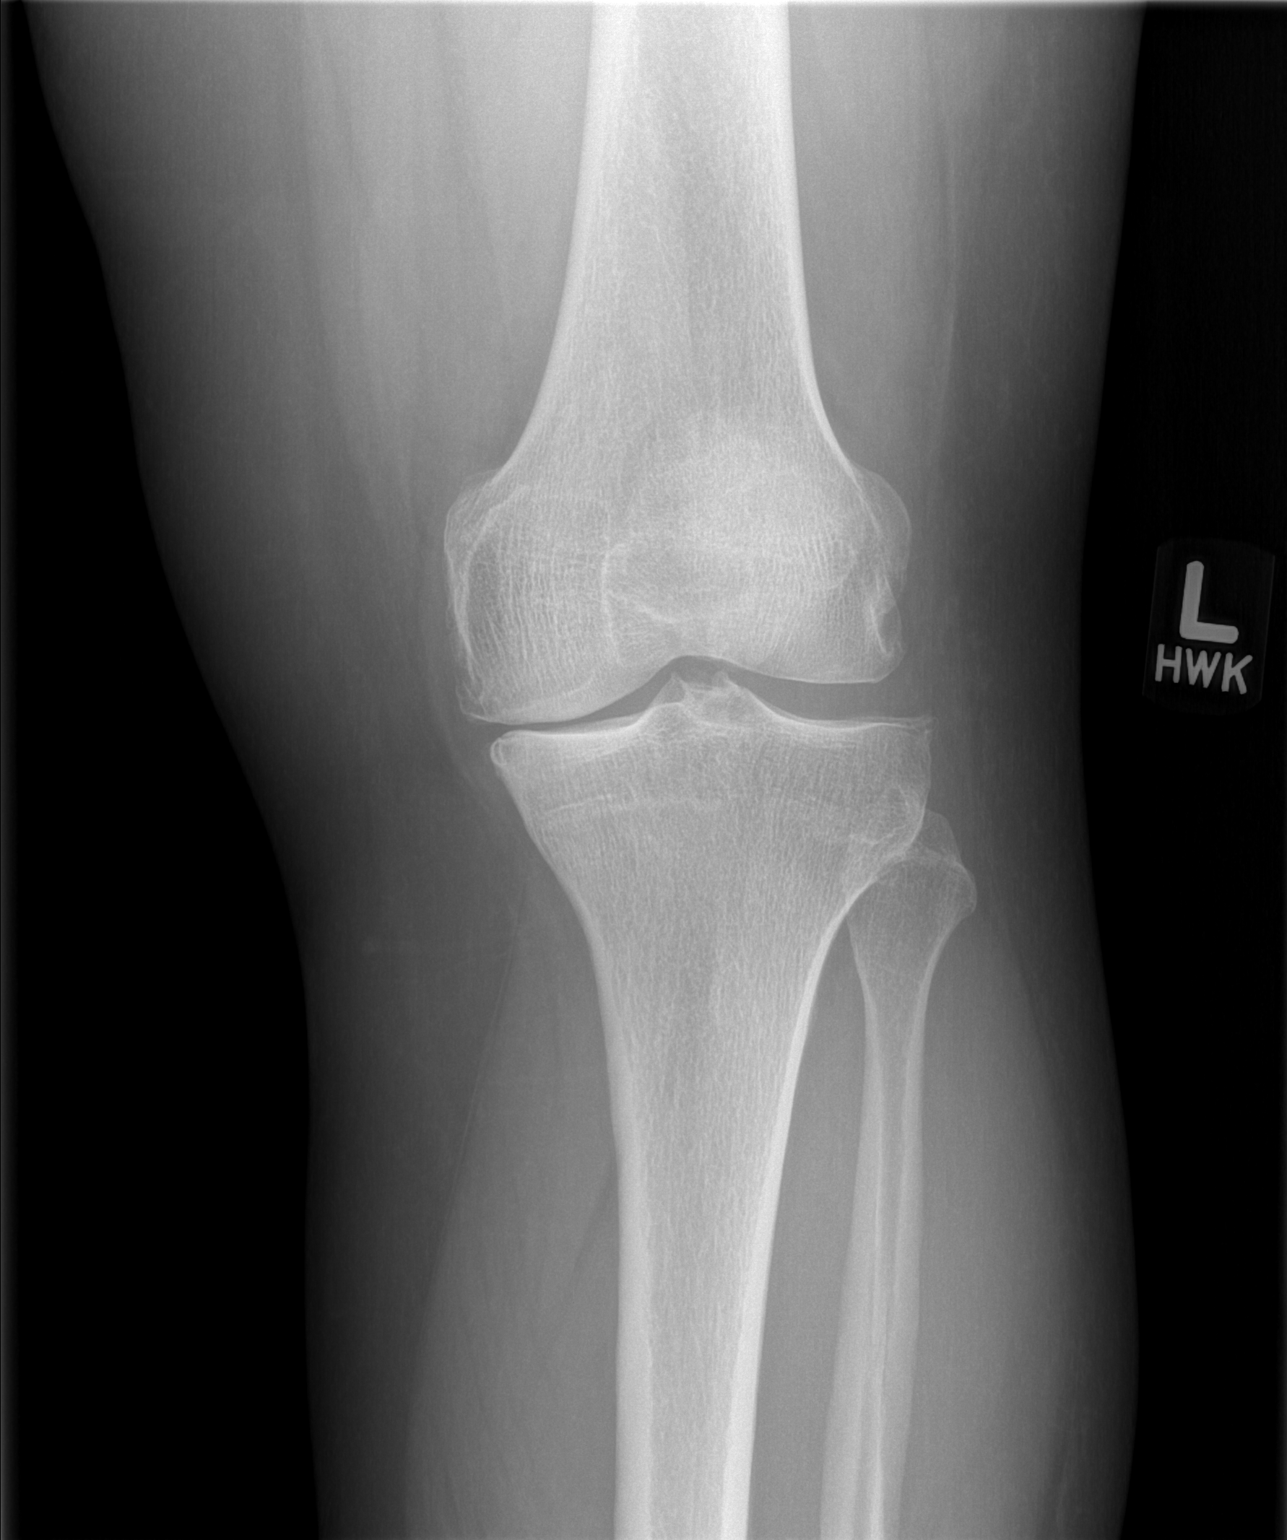

[t knee lat left (2 of 2)]
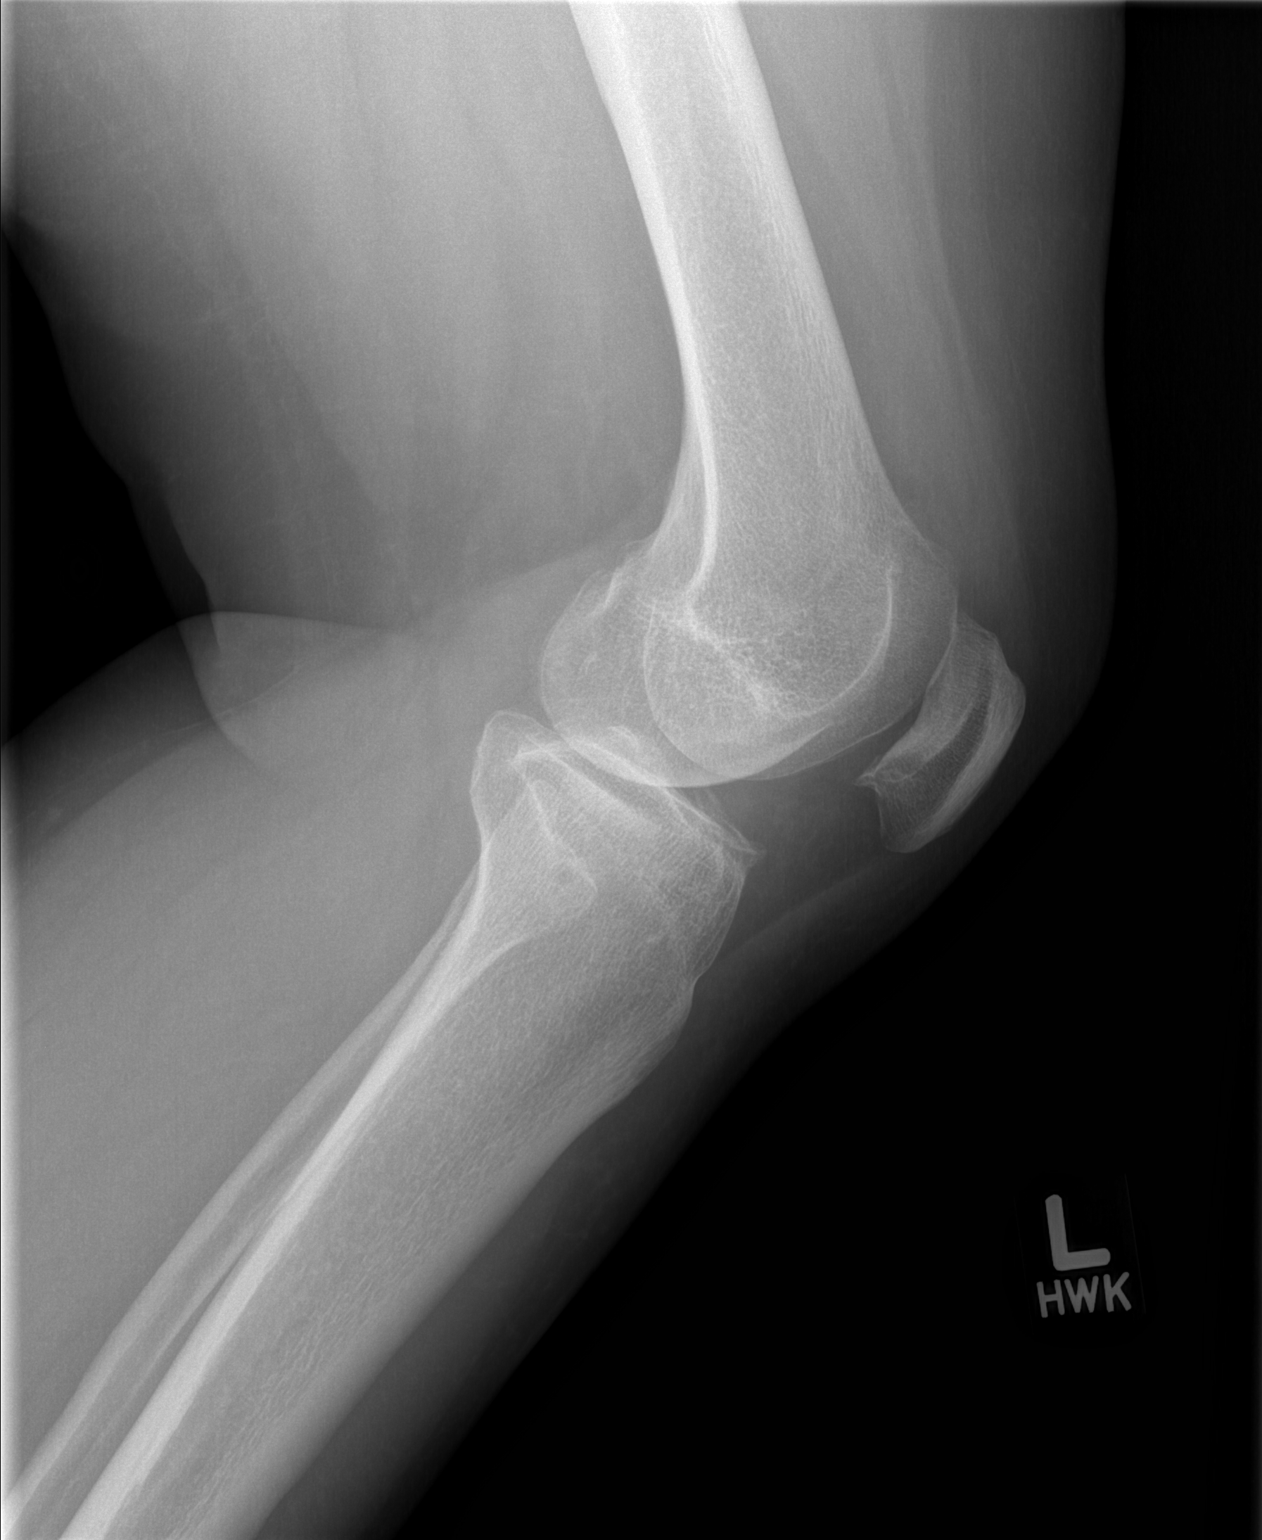

[2 of 2 positions shown; findings below may reference images not displayed]

FINDINGS: Moderate joint space narrowing in the medial compartment.
Mild spurring throughout the knee. No acute bony abnormality.
Specifically, no fracture, subluxation, or dislocation.  Soft
tissues are intact.  Small joint effusion suspected.
IMPRESSION: Mild to moderate degenerative changes, most pronounced in the
medial compartment. No acute bony abnormality.
